# Patient Record
Sex: Male | Born: 1945 | Race: White | Hispanic: No | Marital: Married | Smoking: Never smoker
Health system: Southern US, Community
[De-identification: ages and names within clinical notes are randomized; demographics above are authoritative.]

## PROBLEM LIST (undated history)

## (undated) DIAGNOSIS — E785 Hyperlipidemia, unspecified: Secondary | ICD-10-CM

## (undated) DIAGNOSIS — Z9289 Personal history of other medical treatment: Secondary | ICD-10-CM

## (undated) DIAGNOSIS — N529 Male erectile dysfunction, unspecified: Secondary | ICD-10-CM

## (undated) DIAGNOSIS — I10 Essential (primary) hypertension: Secondary | ICD-10-CM

## (undated) DIAGNOSIS — Z86010 Personal history of colonic polyps: Secondary | ICD-10-CM

## (undated) HISTORY — PX: HERNIA REPAIR: SHX51

## (undated) HISTORY — DX: Personal history of colonic polyps: Z86.010

## (undated) HISTORY — DX: Male erectile dysfunction, unspecified: N52.9

## (undated) HISTORY — DX: Personal history of other medical treatment: Z92.89

## (undated) HISTORY — PX: CATARACT EXTRACTION: SUR2

---

## 2006-06-24 DIAGNOSIS — Z9289 Personal history of other medical treatment: Secondary | ICD-10-CM

## 2006-06-24 HISTORY — DX: Personal history of other medical treatment: Z92.89

## 2006-11-05 ENCOUNTER — Encounter: Admission: RE | Admit: 2006-11-05 | Discharge: 2006-11-05 | Payer: Self-pay | Admitting: General Surgery

## 2007-10-19 ENCOUNTER — Ambulatory Visit (HOSPITAL_BASED_OUTPATIENT_CLINIC_OR_DEPARTMENT_OTHER): Admission: RE | Admit: 2007-10-19 | Discharge: 2007-10-19 | Payer: Self-pay | Admitting: General Surgery

## 2007-10-20 ENCOUNTER — Encounter (INDEPENDENT_AMBULATORY_CARE_PROVIDER_SITE_OTHER): Payer: Self-pay | Admitting: General Surgery

## 2011-01-01 NOTE — Op Note (Signed)
Edward Bush, Edward Bush                  ACCOUNT NO.:  [id number]   MEDICAL RECORD NO.:  [id number]          PATIENT TYPE:  AMB   LOCATION:  DSC                          FACILITY:  [facility]   PHYSICIAN:  Angelia Mould. Derrell Lolling, M.D.DATE OF BIRTH:  [birth year]   DATE OF PROCEDURE:  10/19/2007  DATE OF DISCHARGE:                               OPERATIVE REPORT   PREOPERATIVE DIAGNOSIS:  Soft tissue mass, left posterior neck.   POSTOPERATIVE DIAGNOSIS:  Soft tissue mass, left posterior neck.   OPERATION PERFORMED:  Excision, 1-cm soft tissue mass, left posterior  neck, with layered closure.   SURGEON:  Angelia Mould. Derrell Lolling, M.D.   OPERATIVE INDICATIONS:  This is a 65 year old white man who has had a  soft tissue mass of his left posterior neck at the hairline for several  years.  It has been inflamed and tender in the past and has drained in  the past.  He wants to have this removed.  On exam, there is a mass that  actually feels about 2 cm in size, is firm and round and well-marginated  consistent with a cyst.  There is no active inflammation or drainage at  this time.  He is brought to the operating room electively.   OPERATIVE TECHNIQUE:  The patient was brought to the minor procedure  room at [facility] day surgery center.  He was placed in the right lateral  decubitus position.  The left posterior neck was then prepped and draped  in a sterile fashion.  The patient was identified as correct patient,  correct procedure and correct site.  Xylocaine 1% with epinephrine was  used as a local infiltration anesthetic.  An oblique elliptical incision  was made in Langer's lines.  Dissection was carried down to the deep  subcutaneous tissue and I completely excised what appeared to be an  encapsulated cyst.  The cyst itself was probably 1 cm in size but it  could have been larger.  There was no active purulence.  Hemostasis was  excellent and achieved with pressure.  This was closed in layers.  The  deeper  tissues were closed with three interrupted sutures of 3-0 Vicryl  and the skin closed with a running subcuticular suture of 3-0 Monocryl  and Dermabond.  Clean bandages were placed and the patient was taken to  the recovery room in stable condition.  Estimated blood loss was about 2-  4 mL.  Complications:  None.  Sponge, needle and counts were correct.      Angelia Mould. Derrell Lolling, M.D.  Electronically Signed     HMI/MEDQ  D:  10/19/2007  T:  10/20/2007  Job:  [id number]   cc:   Knox Royalty, MD

## 2011-04-29 HISTORY — PX: TRANSTHORACIC ECHOCARDIOGRAM: SHX275

## 2011-08-07 ENCOUNTER — Encounter: Payer: Self-pay | Admitting: Emergency Medicine

## 2011-08-07 ENCOUNTER — Other Ambulatory Visit: Payer: Self-pay

## 2011-08-07 ENCOUNTER — Emergency Department (HOSPITAL_COMMUNITY)
Admission: EM | Admit: 2011-08-07 | Discharge: 2011-08-07 | Disposition: A | Discharge status: Home / Self Care | Payer: PRIVATE HEALTH INSURANCE | Attending: Emergency Medicine | Admitting: Emergency Medicine

## 2011-08-07 ENCOUNTER — Emergency Department (HOSPITAL_COMMUNITY): Payer: PRIVATE HEALTH INSURANCE

## 2011-08-07 DIAGNOSIS — R110 Nausea: Secondary | ICD-10-CM | POA: Insufficient documentation

## 2011-08-07 DIAGNOSIS — R6889 Other general symptoms and signs: Secondary | ICD-10-CM | POA: Insufficient documentation

## 2011-08-07 DIAGNOSIS — R61 Generalized hyperhidrosis: Secondary | ICD-10-CM | POA: Insufficient documentation

## 2011-08-07 DIAGNOSIS — M546 Pain in thoracic spine: Secondary | ICD-10-CM | POA: Insufficient documentation

## 2011-08-07 DIAGNOSIS — R630 Anorexia: Secondary | ICD-10-CM | POA: Insufficient documentation

## 2011-08-07 DIAGNOSIS — R059 Cough, unspecified: Secondary | ICD-10-CM | POA: Insufficient documentation

## 2011-08-07 DIAGNOSIS — I10 Essential (primary) hypertension: Secondary | ICD-10-CM | POA: Insufficient documentation

## 2011-08-07 DIAGNOSIS — R51 Headache: Secondary | ICD-10-CM | POA: Insufficient documentation

## 2011-08-07 DIAGNOSIS — J3489 Other specified disorders of nose and nasal sinuses: Secondary | ICD-10-CM | POA: Insufficient documentation

## 2011-08-07 DIAGNOSIS — E785 Hyperlipidemia, unspecified: Secondary | ICD-10-CM | POA: Insufficient documentation

## 2011-08-07 DIAGNOSIS — R55 Syncope and collapse: Secondary | ICD-10-CM

## 2011-08-07 DIAGNOSIS — R404 Transient alteration of awareness: Secondary | ICD-10-CM | POA: Insufficient documentation

## 2011-08-07 DIAGNOSIS — J111 Influenza due to unidentified influenza virus with other respiratory manifestations: Secondary | ICD-10-CM

## 2011-08-07 DIAGNOSIS — R05 Cough: Secondary | ICD-10-CM | POA: Insufficient documentation

## 2011-08-07 HISTORY — DX: Essential (primary) hypertension: I10

## 2011-08-07 HISTORY — DX: Hyperlipidemia, unspecified: E78.5

## 2011-08-07 LAB — URINALYSIS, ROUTINE W REFLEX MICROSCOPIC
Bilirubin Urine: NEGATIVE
Glucose, UA: NEGATIVE mg/dL
Hgb urine dipstick: NEGATIVE
Ketones, ur: NEGATIVE mg/dL
Leukocytes, UA: NEGATIVE
Nitrite: NEGATIVE
Protein, ur: NEGATIVE mg/dL
Specific Gravity, Urine: 1.021 (ref 1.005–1.030)
Urobilinogen, UA: 0.2 mg/dL (ref 0.0–1.0)
pH: 5.5 (ref 5.0–8.0)

## 2011-08-07 LAB — CARDIAC PANEL(CRET KIN+CKTOT+MB+TROPI)
CK, MB: 1.8 ng/mL (ref 0.3–4.0)
Relative Index: 1 (ref 0.0–2.5)
Total CK: 180 U/L (ref 7–232)
Troponin I: <0.3 ng/mL (ref <0.30)

## 2011-08-07 LAB — COMPREHENSIVE METABOLIC PANEL
ALT: 25 U/L (ref 0–53)
AST: 21 U/L (ref 0–37)
Albumin: 3.6 g/dL (ref 3.5–5.2)
Alkaline Phosphatase: 55 U/L (ref 39–117)
BUN: 23 mg/dL (ref 6–23)
CO2: 28 mEq/L (ref 19–32)
Calcium: 9.3 mg/dL (ref 8.4–10.5)
Chloride: 98 mEq/L (ref 96–112)
Creatinine, Ser: 1.2 mg/dL (ref 0.50–1.35)
GFR calc Af Amer: 71 mL/min — ABNORMAL LOW (ref >90)
GFR calc non Af Amer: 61 mL/min — ABNORMAL LOW (ref >90)
Glucose, Bld: 130 mg/dL — ABNORMAL HIGH (ref 70–99)
Potassium: 3.3 mEq/L — ABNORMAL LOW (ref 3.5–5.1)
Sodium: 135 mEq/L (ref 135–145)
Total Bilirubin: 1.7 mg/dL — ABNORMAL HIGH (ref 0.3–1.2)
Total Protein: 7 g/dL (ref 6.0–8.3)

## 2011-08-07 LAB — CBC
HCT: 41.4 % (ref 39.0–52.0)
Hemoglobin: 15 g/dL (ref 13.0–17.0)
MCH: 31.3 pg (ref 26.0–34.0)
MCHC: 36.2 g/dL — ABNORMAL HIGH (ref 30.0–36.0)
MCV: 86.3 fL (ref 78.0–100.0)
Platelets: 125 10*3/uL — ABNORMAL LOW (ref 150–400)
RBC: 4.8 MIL/uL (ref 4.22–5.81)
RDW: 13.3 % (ref 11.5–15.5)
WBC: 13.7 10*3/uL — ABNORMAL HIGH (ref 4.0–10.5)

## 2011-08-07 LAB — DIFFERENTIAL
Basophils Absolute: 0 10*3/uL (ref 0.0–0.1)
Basophils Relative: 0 % (ref 0–1)
Eosinophils Absolute: 0.2 10*3/uL (ref 0.0–0.7)
Eosinophils Relative: 2 % (ref 0–5)
Lymphocytes Relative: 15 % (ref 12–46)
Lymphs Abs: 2 10*3/uL (ref 0.7–4.0)
Monocytes Absolute: 1.8 10*3/uL — ABNORMAL HIGH (ref 0.1–1.0)
Monocytes Relative: 13 % — ABNORMAL HIGH (ref 3–12)
Neutro Abs: 9.7 10*3/uL — ABNORMAL HIGH (ref 1.7–7.7)
Neutrophils Relative %: 71 % (ref 43–77)

## 2011-08-07 LAB — D-DIMER, QUANTITATIVE: D-Dimer, Quant: 0.37 ug/mL-FEU (ref 0.00–0.48)

## 2011-08-07 MED ORDER — SODIUM CHLORIDE 0.9 % IV BOLUS (SEPSIS)
1000.0000 mL | Freq: Once | INTRAVENOUS | Status: AC
  Administered 2011-08-07: 1000 mL via INTRAVENOUS

## 2011-08-07 MED ORDER — SODIUM CHLORIDE 0.9 % IV BOLUS (SEPSIS): 1000.0000 mL | Freq: Once | INTRAVENOUS | Status: DC

## 2011-08-07 NOTE — ED Provider Notes (Signed)
Medical screening examination/treatment/procedure(s) were conducted as a shared visit with non-physician practitioner(s) and myself.  I personally evaluated the patient during the encounter  Patient seen by me. Brought in by EMS for syncopal episode that occurred at a local doctor's office where his wife is being seen for flulike symptoms. Patient also describes a 3 day history of flulike illness with congestion bodyaches some mild diarrhea decreased appetite some nausea. In the office patient became ill feeling, very weak, mild nausea, and then passed out and awoke on the floor. Since being in the emergency department he has felt fine feels much better with IV fluids workup in the imaged department without significant findings other than a mild leukocytosis with a white count in the 13,000 range. Chest x-ray was negative. Patient denies any chest pains had no cardiac arrhythmias here. Based on his recent history and the events of the doctor's office I doubt this was a cardiac arrhythmia syncope, suspect it was a vasovagal event secondary to the influenza. Patient will be okay to go home patient prefers to go home.  Shelda Jakes, MD 08/07/11 1929

## 2011-08-07 NOTE — ED Notes (Signed)
BMW:[id number]<GM> Expected date:08/07/11<BR> Expected time: 1:54 PM<BR> Means of arrival:Ambulance<BR> Comments:<BR> m40

## 2011-08-07 NOTE — ED Notes (Signed)
MD at bedside. 

## 2011-08-07 NOTE — ED Notes (Signed)
Rainbow drawn and sent to lab. 1 blue, 1 lavendar, 1 light green and one dark green in mini lab

## 2011-08-07 NOTE — ED Notes (Signed)
Pt given discharge instructions and verbalizes understanding  

## 2011-08-07 NOTE — ED Notes (Signed)
Pt presenting to ed with c/o syncopal episode while with his wife at her doctor's appointment. Pt states he has not been feeling well the past couple of days he feels like he's getting a cold. Pt states he does feel nauseous but denies vomiting at this time. Pt states he was sitting at the time of onset

## 2011-08-07 NOTE — ED Provider Notes (Signed)
History     CSN: [id number] Arrival date & time: 08/07/2011  2:22 PM   First MD Initiated Contact with Patient 08/07/11 1435      Chief Complaint  Patient presents with  . Loss of Consciousness    (Consider location/radiation/quality/duration/timing/severity/associated sxs/prior treatment) Patient is a 65 y.o. male presenting with syncope. The history is provided by the patient.  Loss of Consciousness  Edward Bush is a 64 y.o. male who presents to the emergency department after a syncopal episode.  Pt was in the MD's office with his wife (she was the patient) when pt experienced nausea, diaphoresis and a witnessed full LOC while sitting in a chair.  Pt states he has had a head cold x 3 days with cough, congestion, sinus pain and sore throat.  Pt states he has been eating chicken soup and taking PO fluids and not much solid PO intake.  PMHx HTN, but denies other cardiac history; no previous episodes like this is the past.  Pt did take his normal blood pressure medications this AM.  Pt has also been taking decongestants for 3 days.     Past Medical History  Diagnosis Date  . Hypertension   . Hyperlipemia     History reviewed. No pertinent past surgical history.  No family history on file.  History  Substance Use Topics  . Smoking status: Never Smoker   . Smokeless tobacco: Not on file  . Alcohol Use: No      Review of Systems  Cardiovascular: Positive for syncope.   All pertinent positives and negatives in the history of present illness  Allergies  Review of patient's allergies indicates not on file.  Home Medications  No current outpatient prescriptions on file.  BP 101/77  Pulse 54  Temp(Src) 98 F (36.7 C) (Oral)  Resp 20  SpO2 96%  Physical Exam  Constitutional: He is oriented to person, place, and time. He appears well-developed and well-nourished. No distress.  HENT:  Head: Normocephalic and atraumatic.  Right Ear: External ear normal.  Left Ear:  External ear normal.  Nose: Nose normal.  Mouth/Throat: Oropharynx is clear and moist.  Eyes: Conjunctivae and EOM are normal. Pupils are equal, round, and reactive to light.  Neck: Normal range of motion. Neck supple. No JVD present.  Cardiovascular: Normal rate, regular rhythm, normal heart sounds and intact distal pulses.  Exam reveals no gallop and no friction rub.   No murmur heard. Pulmonary/Chest: Effort normal and breath sounds normal. No respiratory distress. He has no wheezes. He has no rales. He exhibits no tenderness.  Abdominal: Soft. Bowel sounds are normal. He exhibits no distension and no mass. There is no tenderness. There is no rebound and no guarding.  Lymphadenopathy:    He has no cervical adenopathy.  Neurological: He is alert and oriented to person, place, and time. He has normal reflexes.  Skin: Skin is warm and dry. He is not diaphoretic.  Psychiatric: He has a normal mood and affect. His behavior is normal. Judgment and thought content normal.    ED Course  Procedures (including critical care time)  7:09 PM Patient is feeling much better following his first liter of fluid.  Patient is describing what appears to be a vasovagal type syncopal episode and this is based on the fact that he has been recently ill with an influenza-like illness.  Patient states a warm feeling and weak with some mild nausea before waking up on the floor at the doctor's office.  Patient states it has been in taking oral fluids as per normal.  Patient states that he would like to go home.  I feel that this is most likely okay due to the fact that this seems to be a vagal response to the dehydration and his current influenza-like illness.     8:07 PM The patient is improved and still feeling better. He will be ambulated around the department.   8:22 PM A she ambulated without any difficulties and will be sent home and advised to return here for any worsening in his condition.  MDM   MDM Interpretation: labs, ECG and x-ray      Date: 08/07/2011  Rate:54  Rhythm: normal sinus rhythm  QRS Axis: normal  Intervals: normal  ST/T Wave abnormalities: normal  Conduction Disutrbances:left anterior fascicular block  Narrative Interpretation:   Old EKG Reviewed: none available         Carlyle Dolly, PA-C 08/07/11 1913  Carlyle Dolly, PA-C 08/07/11 2005  Carlyle Dolly, PA-C 08/07/11 2022

## 2011-08-07 NOTE — ED Notes (Signed)
Pt had an bowel incontinent episode that he states is not usual for him but he has been sick and just could not 'hold it.' He is getting cleaned up now.

## 2011-08-08 NOTE — ED Provider Notes (Signed)
Medical screening examination/treatment/procedure(s) were performed by non-physician practitioner and as supervising physician I was immediately available for consultation/collaboration.   Eman Morimoto, MD 08/08/11 0641 

## 2012-11-26 ENCOUNTER — Encounter (INDEPENDENT_AMBULATORY_CARE_PROVIDER_SITE_OTHER): Payer: Self-pay | Admitting: General Surgery

## 2012-11-26 ENCOUNTER — Ambulatory Visit (INDEPENDENT_AMBULATORY_CARE_PROVIDER_SITE_OTHER): Payer: Self-pay | Admitting: General Surgery

## 2012-11-26 VITALS — BP 120/82 | HR 60 | Temp 97.2°F | Resp 18 | Ht 69.0 in | Wt 197.0 lb

## 2012-11-26 DIAGNOSIS — L723 Sebaceous cyst: Secondary | ICD-10-CM | POA: Insufficient documentation

## 2012-11-26 NOTE — Patient Instructions (Signed)
You have a large, benign epidermoid cyst of your upper back. It is not infected at this time.  You will be scheduled for excision of this cyst in the minor procedure room in my office in the near future. We will do this under local anesthesia.

## 2012-11-26 NOTE — Progress Notes (Signed)
Patient ID: Edward Bush, male   DOB: 08-Feb-1946, 67 y.o.   MRN: [id number]  Chief Complaint  Patient presents with  . Cyst    HPI Edward Bush is a 67 y.o. male.  He returns to see me  to request removal of a large sebaceous cyst of his upper back.   In 2010 repaired inguinal hernias on this gentleman and he has no problems with that. He states that he has a enlarging soft tissue mass of his upper back. He used to be able to squeeze material out of this but cannot do any more. Both he and his wife of like to have this removed.  Comorbidities include hypertension, hyperlipidemia, on aspirin. He is followed by [place] [facility] for hypertension. His health has been very stable. HPI  Past Medical History  Diagnosis Date  . Hypertension   . Hyperlipemia     Past Surgical History  Procedure Laterality Date  . Hernia repair      No family history on file.  Social History History  Substance Use Topics  . Smoking status: Never Smoker   . Smokeless tobacco: Not on file  . Alcohol Use: No    Allergies  Allergen Reactions  . Sulfa Antibiotics Rash    Current Outpatient Prescriptions  Medication Sig Dispense Refill  . amLODipine (NORVASC) 10 MG tablet Take 10 mg by mouth daily.        Marland Kitchen aspirin EC 81 MG tablet Take 81 mg by mouth daily.        . busPIRone (BUSPAR) 30 MG tablet Take 15 mg by mouth daily. Patient takes half of a 30mg  tablet       . carvedilol (COREG) 25 MG tablet Take 25 mg by mouth 2 (two) times daily with a meal.        . Chlorpheniramine-Acetaminophen (CORICIDIN HBP COLD/FLU PO) Take by mouth as needed.      . fish oil-omega-3 fatty acids 1000 MG capsule Take 1 g by mouth daily.        . Potassium 95 MG TABS Take 1 tablet by mouth daily.        Marland Kitchen VALSARTAN PO Take by mouth daily.       No current facility-administered medications for this visit.    Review of Systems Review of Systems  Constitutional: Negative for fever,  chills and unexpected weight change.  HENT: Negative for hearing loss, congestion, sore throat, trouble swallowing and voice change.   Eyes: Negative for visual disturbance.  Respiratory: Negative for cough and wheezing.   Cardiovascular: Negative for chest pain, palpitations and leg swelling.  Gastrointestinal: Negative for nausea, vomiting, abdominal pain, diarrhea, constipation, blood in stool, abdominal distention, anal bleeding and rectal pain.  Genitourinary: Negative for hematuria and difficulty urinating.  Musculoskeletal: Negative for arthralgias.  Skin: Negative for rash and wound.  Neurological: Negative for seizures, syncope, weakness and headaches.  Hematological: Negative for adenopathy. Does not bruise/bleed easily.  Psychiatric/Behavioral: Negative for confusion.    Blood pressure 120/82, pulse 60, temperature 97.2 F (36.2 C), temperature source Oral, resp. rate 18, height 5\' 9"  (1.753 m), weight 197 lb (89.359 kg).  Physical Exam Physical Exam  Constitutional: He is oriented to person, place, and time. He appears well-developed and well-nourished. No distress.  HENT:  Head: Normocephalic.  Eyes: Conjunctivae and EOM are normal. Pupils are equal, round, and reactive to light. Right eye exhibits no discharge. Left eye exhibits no discharge. No  scleral icterus.  Neck: Normal range of motion. Neck supple. No JVD present. No tracheal deviation present. No thyromegaly present.  Cardiovascular: Normal rate, regular rhythm, normal heart sounds and intact distal pulses.   No murmur heard. Pulmonary/Chest: Effort normal and breath sounds normal. No stridor. No respiratory distress. He has no wheezes. He has no rales. He exhibits no tenderness.  No axillary adenopathy. No other chest wall masses.  Abdominal: Soft. Bowel sounds are normal. He exhibits no distension.  Musculoskeletal: Normal range of motion. He exhibits no edema and no tenderness.  Lymphadenopathy:    He has no  cervical adenopathy.  Neurological: He is alert and oriented to person, place, and time. He has normal reflexes. Coordination normal.  Skin: Skin is warm and dry. No rash noted. He is not diaphoretic. No erythema. No pallor.  3 cm sebaceous cyst in midline of back, overlying approximately T3. Not draining. Not obviously infected.  Psychiatric: He has a normal mood and affect. His behavior is normal. Judgment and thought content normal.    Data Reviewed Chart records  Assessment    3 cm soft tissue mass midline upper thoracic area. Suspect epidermoid cyst  Hypertension  History inguinal hernia surgery. No evidence of recurrence  Hyperlipidemia     Plan    Scheduled for excision of this soft tissue mass of his back under local anesthesia in the office in the near future.He declined IV sedation or anesthesia for this.  Discontinue aspirin 3 or 4 days preop  I discussed the indications, details, techniques, and numerous risks of the surgery with him. He understands all the scope issues and all his questions are answered. He agrees with this plan.        Angelia Mould. Derrell Lolling, M.D., [facility] General and Minimally invasive Surgery Breast and Colorectal Surgery Office:   [phone] Pager:   [phone]  11/26/2012, 8:59 AM

## 2012-12-11 ENCOUNTER — Encounter (INDEPENDENT_AMBULATORY_CARE_PROVIDER_SITE_OTHER): Payer: Self-pay | Admitting: General Surgery

## 2012-12-11 ENCOUNTER — Ambulatory Visit (INDEPENDENT_AMBULATORY_CARE_PROVIDER_SITE_OTHER): Payer: Medicare Other | Admitting: General Surgery

## 2012-12-11 ENCOUNTER — Ambulatory Visit (INDEPENDENT_AMBULATORY_CARE_PROVIDER_SITE_OTHER): Payer: Medicare Other

## 2012-12-11 VITALS — BP 148/90 | HR 60 | Temp 97.3°F | Resp 16 | Ht 69.0 in | Wt 196.4 lb

## 2012-12-11 DIAGNOSIS — L723 Sebaceous cyst: Secondary | ICD-10-CM

## 2012-12-11 NOTE — Progress Notes (Signed)
Patient ID: Edward Bush, male   DOB: May 03, 1946, 67 y.o.   MRN: [id number] History: Patient returns for removal of 3.5 cm sebaceous cyst of upper mid back. Medication allergy history reconfirmed.  Procedure note: Patient taken to the minor procedure room placed prone. Upper back prepped and draped in sterile fashion. 1% Xylocaine with epinephrine local. Transverse elliptical incision approximately 1 cm vertically by 6 cm transversely. Incision made with knife.  dissection  widely all the way down to the muscle fascia excising what appeared to be a large benign sebaceous cyst, multilobulated. Completely excised. Minor capillary bleeding packed for a few minutes and this seemed to control that. Subcutaneous tissue closed with interrupted 3-0 Vicryl sutures. Skin closed with interrupted 3-0 nylon sutures. No bleeding. Dry gauze bandage. Tolerated well.  Assessment: 3.5 cm sebaceous cyst upper back, excised today with layered closure  Plan: Prescription for Vicodin given Wound care discussed and written instructions given Return 10 days for suture removal Specimen sent to pathology.  Angelia Mould. Derrell Lolling, M.D., [facility] General and Minimally invasive Surgery Breast and Colorectal Surgery Office:   [phone] Pager:   [phone]  .

## 2012-12-11 NOTE — Patient Instructions (Signed)
Go home and do very little physical activity today. Put an ice pack on the wound intermittently for the next 24 hours.  Change the bandage if it becomes wet or soiled otherwise leave it in place for 48 hours.  May take a quick shower, starting Sunday.  Call office if there is any unusual bleeding or excessive pain.  You'll be given an appointment in about 10 days to have the sutures removed.

## 2012-12-14 ENCOUNTER — Telehealth (INDEPENDENT_AMBULATORY_CARE_PROVIDER_SITE_OTHER): Payer: Self-pay

## 2012-12-14 NOTE — Telephone Encounter (Signed)
Message copied by Ivory Broad on Mon Dec 14, 2012  4:49 PM ------      Message from: Ernestene Mention      Created: Mon Dec 14, 2012  4:45 PM       Inform patient of Pathology report,.  Benign cyst, as expected. ------

## 2012-12-14 NOTE — Telephone Encounter (Signed)
I called and notified the pt.  He asked about removing the bandage.  I told him only to keep it on if it drains any or if his clothes irritate the incision by rubbing on the sutures.

## 2012-12-14 NOTE — Progress Notes (Signed)
Quick Note:  Inform patient of Pathology report,. Benign cyst, as expected. ______

## 2012-12-21 ENCOUNTER — Ambulatory Visit (INDEPENDENT_AMBULATORY_CARE_PROVIDER_SITE_OTHER): Payer: Medicare Other

## 2012-12-21 DIAGNOSIS — Z4802 Encounter for removal of sutures: Secondary | ICD-10-CM

## 2012-12-21 NOTE — Progress Notes (Signed)
Pt comes in today for suture removal post cyst removal.  After getting the pt situated on the table I examined the incision.  The incision looks good.  I then moistened the incision with chloraprep and removed a total of 5 sutures.  I then placed 3 steri-strips on the incision.

## 2013-03-23 ENCOUNTER — Encounter: Payer: Self-pay | Admitting: *Deleted

## 2013-03-26 ENCOUNTER — Ambulatory Visit (INDEPENDENT_AMBULATORY_CARE_PROVIDER_SITE_OTHER): Payer: Medicare Other | Admitting: Internal Medicine

## 2013-03-26 ENCOUNTER — Encounter: Payer: Self-pay | Admitting: Internal Medicine

## 2013-03-26 VITALS — BP 136/88 | HR 56 | Ht 69.5 in | Wt 197.2 lb

## 2013-03-26 DIAGNOSIS — I10 Essential (primary) hypertension: Secondary | ICD-10-CM

## 2013-03-26 DIAGNOSIS — E785 Hyperlipidemia, unspecified: Secondary | ICD-10-CM

## 2013-03-26 DIAGNOSIS — N529 Male erectile dysfunction, unspecified: Secondary | ICD-10-CM

## 2013-03-26 NOTE — Patient Instructions (Addendum)
Your physician wants you to follow-up in: 1 year. You will receive a reminder letter in the mail two months in advance. If you don't receive a letter, please call our office to schedule the follow-up appointment.  

## 2013-03-26 NOTE — Progress Notes (Signed)
OFFICE NOTE  Chief Complaint:  Routine office visit  Primary Care Physician: Barkley Bruns, MD  HPI: Edward Bush is a 67 year old gentleman with hypertension, erectile dysfunction, mild dyslipidemia. Blood pressures were mildly elevated, however, he was having difficulty affording his previous regimen of Micardis and Tekturna. I had discontinued the Micardis and put him on valsartan/hydrochlorothiazide which is working fairly well and he was interested in coming off the [place] due to cost. I recommended starting him on hydralazine at this time and he is done fairly well with this. He actually is taking hydralazine only once daily and twice if he needs to, but blood pressure control seems to be improved. He is otherwise asymptomatic, no chest pain, shortness of breath, palpitations, presyncope or syncopal symptoms. His other concerns today are that he is requesting refills on his buspirone which I will defer to his primary care provider. He also is interested in a sedan a filled prescription. He believes that he can get the medication cheaper through [facility] in [place].  PMHx:  Past Medical History  Diagnosis Date  . Hypertension   . Hyperlipemia   . ED (erectile dysfunction)   . History of nuclear stress test 06/24/2006    exercise myoview; normal pattern of perfusion; low risk scan     Past Surgical History  Procedure Laterality Date  . Hernia repair    . Transthoracic echocardiogram  04/29/2011    EF>55%; mild concentric LVH; LA mod dilated; trace MR/trace TR,/trace pulm valve regurg;     FAMHx:  Family History  Problem Relation Age of Onset  . Hypertension Mother   . Hypertension Maternal Grandmother   . Cancer Maternal Grandmother   . Cancer - Lung Maternal Grandfather   . Hypertension Brother     x2  . Cancer Brother     kidney cancer    SOCHx:   reports that he has never smoked. He has never used smokeless tobacco. He reports that he does not drink alcohol  or use illicit drugs.  ALLERGIES:  Allergies  Allergen Reactions  . Sulfa Antibiotics Rash    ROS: A comprehensive review of systems was negative except for: Genitourinary: positive for ED Behavioral/Psych: positive for anxiety  HOME MEDS: Current Outpatient Prescriptions  Medication Sig Dispense Refill  . amLODipine (NORVASC) 10 MG tablet Take 10 mg by mouth daily.        Marland Kitchen aspirin EC 81 MG tablet Take 81 mg by mouth daily.        . busPIRone (BUSPAR) 30 MG tablet Take 15 mg by mouth 2 (two) times daily.       . carvedilol (COREG) 25 MG tablet Take 25 mg by mouth 2 (two) times daily with a meal.        . Chlorpheniramine-Acetaminophen (CORICIDIN HBP COLD/FLU PO) Take by mouth as needed.      . fish oil-omega-3 fatty acids 1000 MG capsule Take 1 g by mouth daily.        . hydrALAZINE (APRESOLINE) 25 MG tablet Take 25 mg by mouth 3 (three) times daily.       . Potassium 95 MG TABS Take 1 tablet by mouth daily.        . sildenafil (REVATIO) 20 MG tablet Take 100 mg by mouth daily. Take 5 tabs      . valsartan-hydrochlorothiazide (DIOVAN-HCT) 320-25 MG per tablet Take 1 tablet by mouth daily.       No current facility-administered medications for this visit.  LABS/IMAGING: No results found for this or any previous visit (from the past 48 hour(s)). No results found.  VITALS: BP 136/88  Pulse 56  Ht 5' 9.5" (1.765 m)  Wt 197 lb 3.2 oz (89.449 kg)  BMI 28.71 kg/m2  EXAM: General appearance: alert and no distress Neck: no adenopathy, no carotid bruit, no JVD, supple, symmetrical, trachea midline and thyroid not enlarged, symmetric, no tenderness/mass/nodules Lungs: clear to auscultation bilaterally Heart: regular rate and rhythm, S1, S2 normal, no murmur, click, rub or gallop Abdomen: soft, non-tender; bowel sounds normal; no masses,  no organomegaly Extremities: extremities normal, atraumatic, no cyanosis or edema Pulses: 2+ and symmetric Skin: Skin color, texture, turgor  normal. No rashes or lesions Neurologic: Grossly normal  EKG: Sinus bradycardia 56, left posterior fascicular block  ASSESSMENT: 1. Hypertension-controlled 2. Dyslipidemia 3. Erectile dysfunction 4. Anxiety  PLAN: 1.   Edward Bush is doing fairly well with regards to his blood pressure. He is only taking the hydralazine once daily but this seems to work for him. We'll continue that and of course she can take a second pill as needed if his blood pressure is elevated. I defer management of his anxiety to his primary care provider did provide a prescription at his request for sildenafil today. Hopefully this can be provided by his primary care provider in the future. I've cautioned him against using nitrates with sildenafil, however he is not on any currently. We can see him back in followup in a year.  Chrystie Nose, MD, [facility] Attending Cardiologist [facility]  HILTY,Kenneth C 03/26/2013, 11:12 AM

## 2013-05-12 ENCOUNTER — Telehealth: Payer: Self-pay | Admitting: Internal Medicine

## 2013-05-12 NOTE — Telephone Encounter (Signed)
Patient has a question about one of his medications, Amlodipine?

## 2013-05-12 NOTE — Telephone Encounter (Signed)
Returned call.  Pt stated he is taking amlodipine and it seems to be causing his feet and hands to swell.  Pt wants to know if Dr. Rennis Golden will prescribe something else that doesn't have those side effects.  Stated he read that it can cause it and stopped it for a few days and the swelling went away.  Pt denied any other problems or symptoms.  Stated he would rather have something that doesn't cause the swelling.  Pt informed Dr. Rennis Golden will be notified for further instructions.  Pt verbalized understanding and agreed w/ plan.  Pt uses [facility] [address]  Message forwarded to Dr. Rennis Golden.

## 2013-05-12 NOTE — Telephone Encounter (Signed)
Returned call and informed pt per instructions by MD/PA.  Pt verbalized understanding.  Pt advised to contact his pharmacist to get updated information on clonidine and if symptoms persist and he would like to revisit issue to call back.  Pt agreed w/ plan.

## 2013-05-12 NOTE — Telephone Encounter (Signed)
Amlodipine certainly can cause this .Marland Kitchen He has been on this medication for a while. He is also on 2 other blood pressure medications and his blood pressure is not optimally controlled. I'm not sure there is a good alternative to amlodipine for his blood pressure. He is already on high dose b-blocker and combination ARB/diuretic as well as hydralazine. The only other option may be an alpha-blocker such as clonidine, but that medication has significant side-effects as well.  -Dr. Rennis Golden

## 2013-05-20 ENCOUNTER — Telehealth: Payer: Self-pay | Admitting: Internal Medicine

## 2013-05-20 ENCOUNTER — Telehealth: Payer: Self-pay | Admitting: *Deleted

## 2013-05-20 MED ORDER — CLONIDINE HCL 0.1 MG PO TABS: 0.1000 mg | ORAL_TABLET | Freq: Two times a day (BID) | ORAL | Status: DC

## 2013-05-20 NOTE — Telephone Encounter (Signed)
Message copied by Lindell Spar on Thu May 20, 2013  3:14 PM ------      Message from: Chrystie Nose      Created: Thu May 20, 2013  3:08 PM       Ok .Marland Kitchen Have him take clonidine 0.1 mg twice daily instead of amlodipine. Dry mouth, urinary retention, fatigue and other side effects may be noted with this medication.  They could be worse than the swelling.  FYI.            -Dr. Rennis Golden      ----- Message -----         From: Gaynelle Cage, CMA         Sent: 05/20/2013   2:52 PM           To: Chrystie Nose, MD            Dr Rennis Golden  This patient said that you offered to change his amlodipine to something different due to swelling. He has decided to do so. Please advise Eileen Stanford what to call in for him and let the patient know. Thanks.       ------

## 2013-05-20 NOTE — Telephone Encounter (Signed)
Patient states Dr. Rennis Golden said  he would change his amlodipine to something else due to swelling. I will notify Dr. Rennis Golden the patient has decided to try something different.

## 2013-05-20 NOTE — Telephone Encounter (Signed)
Please call-concerning switching his medicine.

## 2013-05-20 NOTE — Telephone Encounter (Signed)
Called patient with recommendations per Dr. Rennis Golden to discontinue amlodipine 10mg  use and start clonidine 0.1mg  twice daily. Informed patient to keep track of BPs and call us in a few weeks to report how he is doing on this medication and if he is having SE. Patient agreed with plan and verbalized understanding. Order for new medication placed and sent to pharmacy electronically.

## 2013-07-14 ENCOUNTER — Telehealth: Payer: Self-pay | Admitting: Internal Medicine

## 2013-07-14 NOTE — Telephone Encounter (Signed)
Pt tried the clonidine and had severe dry mouth.  He said that it controlled his blood pressure but couldn't tolerate the side effects.  He went back on the amlodipine.  I advised to check his blood pressure every couple of days to verity that it is under control with all of the changes that have been made recently.  Pt verbalized understanding.

## 2013-07-14 NOTE — Telephone Encounter (Signed)
Wanted you to know he went back Amlodipine.

## 2013-08-20 ENCOUNTER — Other Ambulatory Visit: Payer: Self-pay | Admitting: Internal Medicine

## 2013-08-20 NOTE — Telephone Encounter (Signed)
Rx was sent to pharmacy electronically. 

## 2014-02-11 ENCOUNTER — Other Ambulatory Visit: Payer: Self-pay | Admitting: Internal Medicine

## 2014-02-11 NOTE — Telephone Encounter (Signed)
Rx refill sent to patient pharmacy   

## 2014-08-08 ENCOUNTER — Other Ambulatory Visit: Payer: Self-pay | Admitting: Internal Medicine

## 2014-08-08 NOTE — Telephone Encounter (Signed)
Rx refill sent to patient pharmacy  Note sent to pharmacy for patient to make appointment

## 2014-10-07 ENCOUNTER — Telehealth: Payer: Self-pay | Admitting: Internal Medicine

## 2014-10-07 MED ORDER — CARVEDILOL 25 MG PO TABS: 25.0000 mg | ORAL_TABLET | Freq: Two times a day (BID) | ORAL | Status: DC

## 2014-10-07 MED ORDER — HYDRALAZINE HCL 25 MG PO TABS: 25.0000 mg | ORAL_TABLET | Freq: Three times a day (TID) | ORAL | Status: DC

## 2014-10-07 MED ORDER — VALSARTAN-HYDROCHLOROTHIAZIDE 320-25 MG PO TABS: 1.0000 | ORAL_TABLET | Freq: Every day | ORAL | Status: DC

## 2014-10-07 MED ORDER — AMLODIPINE BESYLATE 10 MG PO TABS: 10.0000 mg | ORAL_TABLET | Freq: Every day | ORAL | Status: DC

## 2014-10-07 NOTE — Telephone Encounter (Signed)
Rx refill sent to patient pharmacy  Spoke with patient to let him know that he is only allowed to get a 30 day supply until his appointment with Dr.Hilty on 3/11. Patient voiced understanding

## 2014-10-07 NOTE — Telephone Encounter (Signed)
°  1. Which medications need to be refilled? Valsartan HCTZ, Amlodipine,Carvedilol, Hydralazine  2. Which pharmacy is medication to be sent to?[facility]  3. Do they need a 30 day or 90 day supply? 90  4. Would they like a call back once the medication has been sent to the pharmacy? yes

## 2014-10-28 ENCOUNTER — Ambulatory Visit (INDEPENDENT_AMBULATORY_CARE_PROVIDER_SITE_OTHER): Payer: Medicare Other | Admitting: Internal Medicine

## 2014-10-28 ENCOUNTER — Encounter: Payer: Self-pay | Admitting: Internal Medicine

## 2014-10-28 VITALS — BP 150/82 | HR 61 | Ht 68.0 in | Wt 201.4 lb

## 2014-10-28 DIAGNOSIS — E785 Hyperlipidemia, unspecified: Secondary | ICD-10-CM

## 2014-10-28 DIAGNOSIS — I10 Essential (primary) hypertension: Secondary | ICD-10-CM

## 2014-10-28 MED ORDER — CARVEDILOL 25 MG PO TABS: 25.0000 mg | ORAL_TABLET | Freq: Two times a day (BID) | ORAL | Status: DC

## 2014-10-28 MED ORDER — HYDRALAZINE HCL 25 MG PO TABS: 25.0000 mg | ORAL_TABLET | Freq: Three times a day (TID) | ORAL | Status: DC

## 2014-10-28 MED ORDER — VALSARTAN-HYDROCHLOROTHIAZIDE 320-25 MG PO TABS: 1.0000 | ORAL_TABLET | Freq: Every day | ORAL | Status: DC

## 2014-10-28 MED ORDER — AMLODIPINE BESYLATE 10 MG PO TABS: 10.0000 mg | ORAL_TABLET | Freq: Every day | ORAL | Status: DC

## 2014-10-28 NOTE — Progress Notes (Signed)
OFFICE NOTE  Chief Complaint:  Routine office visit  Primary Care Physician: Pauline Good, MD  HPI: Edward Bush is a 69 year old gentleman with hypertension, erectile dysfunction, mild dyslipidemia. Blood pressures were mildly elevated, however, he was having difficulty affording his previous regimen of Micardis and Tekturna. I had discontinued the Micardis and put him on valsartan/hydrochlorothiazide which is working fairly well and he was interested in coming off the [place] due to cost. I recommended starting him on hydralazine at this time and he is done fairly well with this. He actually is taking hydralazine only once daily and twice if he needs to, but blood pressure control seems to be improved. He is otherwise asymptomatic, no chest pain, shortness of breath, palpitations, presyncope or syncopal symptoms. His other concerns today are that he is requesting refills on his buspirone which I will defer to his primary care provider. He also is interested in a sedan a filled prescription. He believes that he can get the medication cheaper through [facility] in [place].  SI Mr. no other back in the office today. It's been a couple of years since I seen him. He's had some problems with swelling in his ankles and was switched to clonidine, but had significant dry mouth and then switch back to amlodipine. Unfortunately he's on a number of blood pressure medications and doses and requires his medicines to maintain normal blood pressures. He has no cardiac complaints, specifically denying any shortness of breath or chest pain. EKG today shows normal sinus rhythm at 61.  PMHx:  Past Medical History  Diagnosis Date  . Hypertension   . Hyperlipemia   . ED (erectile dysfunction)   . History of nuclear stress test 06/24/2006    exercise myoview; normal pattern of perfusion; low risk scan     Past Surgical History  Procedure Laterality Date  . Hernia repair    . Transthoracic  echocardiogram  04/29/2011    EF>55%; mild concentric LVH; LA mod dilated; trace MR/trace TR,/trace pulm valve regurg;     FAMHx:  Family History  Problem Relation Age of Onset  . Hypertension Mother   . Hypertension Maternal Grandmother   . Cancer Maternal Grandmother   . Cancer - Lung Maternal Grandfather   . Hypertension Brother     x2  . Cancer Brother     kidney cancer    SOCHx:   reports that he has never smoked. He has never used smokeless tobacco. He reports that he does not drink alcohol or use illicit drugs.  ALLERGIES:  Allergies  Allergen Reactions  . Sulfa Antibiotics Rash    ROS: A comprehensive review of systems was negative except for: Cardiovascular: positive for lower extremity edema Genitourinary: positive for ED Behavioral/Psych: positive for anxiety  HOME MEDS: Current Outpatient Prescriptions  Medication Sig Dispense Refill  . amLODipine (NORVASC) 10 MG tablet Take 1 tablet (10 mg total) by mouth daily. 90 tablet 3  . aspirin EC 81 MG tablet Take 81 mg by mouth daily.      . busPIRone (BUSPAR) 30 MG tablet Take 15 mg by mouth 2 (two) times daily.     . carvedilol (COREG) 25 MG tablet Take 1 tablet (25 mg total) by mouth 2 (two) times daily. 180 tablet 3  . Chlorpheniramine-Acetaminophen (CORICIDIN HBP COLD/FLU PO) Take by mouth as needed.    . fish oil-omega-3 fatty acids 1000 MG capsule Take 1 g by mouth daily.      . hydrALAZINE (APRESOLINE) 25  MG tablet Take 1 tablet (25 mg total) by mouth 3 (three) times daily. 270 tablet 3  . Potassium 95 MG TABS Take 1 tablet by mouth daily.      . sildenafil (REVATIO) 20 MG tablet Take 100 mg by mouth daily. Take 5 tabs    . valsartan-hydrochlorothiazide (DIOVAN-HCT) 320-25 MG per tablet Take 1 tablet by mouth daily. 90 tablet 3   No current facility-administered medications for this visit.    LABS/IMAGING: No results found for this or any previous visit (from the past 48 hour(s)). No results  found.  VITALS: BP 150/82 mmHg  Pulse 61  Ht 5\' 8"  (1.727 m)  Wt 201 lb 6.4 oz (91.354 kg)  BMI 30.63 kg/m2  EXAM: General appearance: alert and no distress Neck: no adenopathy, no carotid bruit, no JVD, supple, symmetrical, trachea midline and thyroid not enlarged, symmetric, no tenderness/mass/nodules Lungs: clear to auscultation bilaterally Heart: regular rate and rhythm, S1, S2 normal, no murmur, click, rub or gallop Abdomen: soft, non-tender; bowel sounds normal; no masses,  no organomegaly Extremities: extremities normal, atraumatic, no cyanosis or edema Pulses: 2+ and symmetric Skin: Skin color, texture, turgor normal. No rashes or lesions Neurologic: Grossly normal  EKG: Normal sinus rhythm at 61  ASSESSMENT: 1. Hypertension-controlled 2. Dyslipidemia - followed by PCP 3. Erectile dysfunction 4. Anxiety 5. Leg edema  PLAN: 1.   Mr. Benegas is doing fairly well with regards to his blood pressure. Unfortunately he's maximized on a number different blood pressure medications. He's having some swelling in his legs which is probably related to amlodipine, however I do not feels blood pressure will be well-controlled if we come off of it. I also found out today that he's only taking his hydralazine once a day. It's recommended that he take this at least twice a day if not 3 times a day, separated every 8 hours. He said he go ahead and try to do that and inform us of his blood pressures. Plan to see him back annually or sooner as necessary.  Pixie Casino, MD, [facility] Attending Cardiologist [facility] 10/28/2014, 6:23 PM

## 2014-10-28 NOTE — Patient Instructions (Signed)
Your physician wants you to follow-up in 1 year with Dr. Debara Pickett. You will receive a reminder letter in the mail 2 months in advance. If you do not receive a letter, please call our office to schedule the follow-up appointment.

## 2015-01-05 ENCOUNTER — Telehealth: Payer: Self-pay | Admitting: Internal Medicine

## 2015-01-05 ENCOUNTER — Other Ambulatory Visit: Payer: Self-pay | Admitting: *Deleted

## 2015-01-05 MED ORDER — SILDENAFIL CITRATE 25 MG PO TABS
25.0000 mg | ORAL_TABLET | Freq: Every day | ORAL | Status: DC | PRN
Start: 2015-01-05 — End: 2015-01-05

## 2015-01-05 MED ORDER — SILDENAFIL CITRATE 100 MG PO TABS: 100.0000 mg | ORAL_TABLET | Freq: Every day | ORAL | Status: DC | PRN

## 2015-01-05 NOTE — Telephone Encounter (Signed)
Spoke with pt, aware refill sent to the pharmacy. 

## 2015-01-05 NOTE — Telephone Encounter (Signed)
°  1. Which medications need to be refilled? Viagra-new prescription  2. Which pharmacy is medication to be sent to? [facility]  3. Do they need a 30 day or 90 day supply? 10 and refills  4. Would they like a call back once the medication has been sent to the pharmacy? yes

## 2015-02-22 ENCOUNTER — Encounter: Payer: Self-pay | Admitting: Internal Medicine

## 2015-09-08 ENCOUNTER — Other Ambulatory Visit: Payer: Self-pay | Admitting: Internal Medicine

## 2015-09-08 NOTE — Telephone Encounter (Signed)
Rx(s) sent to pharmacy electronically.  

## 2015-11-07 ENCOUNTER — Encounter: Payer: Self-pay | Admitting: Internal Medicine

## 2015-11-07 ENCOUNTER — Ambulatory Visit (INDEPENDENT_AMBULATORY_CARE_PROVIDER_SITE_OTHER): Payer: Medicare Other | Admitting: Internal Medicine

## 2015-11-07 VITALS — BP 138/78 | HR 64 | Ht 68.0 in | Wt 205.4 lb

## 2015-11-07 DIAGNOSIS — E785 Hyperlipidemia, unspecified: Secondary | ICD-10-CM

## 2015-11-07 DIAGNOSIS — I10 Essential (primary) hypertension: Secondary | ICD-10-CM

## 2015-11-07 DIAGNOSIS — N528 Other male erectile dysfunction: Secondary | ICD-10-CM | POA: Diagnosis not present

## 2015-11-07 MED ORDER — VALSARTAN-HYDROCHLOROTHIAZIDE 320-25 MG PO TABS: ORAL_TABLET | ORAL | Status: DC

## 2015-11-07 MED ORDER — AMLODIPINE BESYLATE 10 MG PO TABS: ORAL_TABLET | ORAL | Status: DC

## 2015-11-07 MED ORDER — CARVEDILOL 25 MG PO TABS: ORAL_TABLET | ORAL | Status: DC

## 2015-11-07 MED ORDER — HYDRALAZINE HCL 25 MG PO TABS: ORAL_TABLET | ORAL | Status: DC

## 2015-11-07 NOTE — Patient Instructions (Signed)
Your physician wants you to follow-up in: 1 year with Dr. Hilty. You will receive a reminder letter in the mail two months in advance. If you don't receive a letter, please call our office to schedule the follow-up appointment.  Your medications have been refilled.  

## 2015-11-07 NOTE — Progress Notes (Signed)
OFFICE NOTE  Chief Complaint:  Routine office visit   Primary Care Physician: Andria Frames, MD  HPI: Edward SUSTAITA is a 70 year old gentleman with hypertension, erectile dysfunction, mild dyslipidemia. Blood pressures were mildly elevated, however, he was having difficulty affording his previous regimen of Micardis and Tekturna. I had discontinued the Micardis and put him on valsartan/hydrochlorothiazide which is working fairly well and he was interested in coming off the [place] due to cost. I recommended starting him on hydralazine at this time and he is done fairly well with this. He actually is taking hydralazine only once daily and twice if he needs to, but blood pressure control seems to be improved. He is otherwise asymptomatic, no chest pain, shortness of breath, palpitations, presyncope or syncopal symptoms. His other concerns today are that he is requesting refills on his buspirone which I will defer to his primary care provider. He also is interested in a sedan a filled prescription. He believes that he can get the medication cheaper through [facility] in [place].  I saw Edward Bush back in the office today. It's been a couple of years since I seen him. He's had some problems with swelling in his ankles and was switched to clonidine, but had significant dry mouth and then switch back to amlodipine. Unfortunately he's on a number of blood pressure medications and doses and requires his medicines to maintain normal blood pressures. He has no cardiac complaints, specifically denying any shortness of breath or chest pain. EKG today shows normal sinus rhythm at 61.  Edward Bush returns today for follow-up. He reports doing well over the past year. His swelling is improved significantly, probably due to cooler weather not being on his feet as much. Blood pressure is been pretty well-controlled although he is only taking the hydralazine once a day. I suspect that he may be having  afternoon rises in blood pressure that is not aware of. I advised him to take the medicine at least twice a day, or more ideally 3 times a day. He says this is difficult to remember, which I understand. He denies any chest pain or worsening shortness of breath.  PMHx:  Past Medical History  Diagnosis Date  . Hypertension   . Hyperlipemia   . ED (erectile dysfunction)   . History of nuclear stress test 06/24/2006    exercise myoview; normal pattern of perfusion; low risk scan     Past Surgical History  Procedure Laterality Date  . Hernia repair    . Transthoracic echocardiogram  04/29/2011    EF>55%; mild concentric LVH; LA mod dilated; trace MR/trace TR,/trace pulm valve regurg;     FAMHx:  Family History  Problem Relation Age of Onset  . Hypertension Mother   . Hypertension Maternal Grandmother   . Cancer Maternal Grandmother   . Cancer - Lung Maternal Grandfather   . Hypertension Brother     x2  . Cancer Brother     kidney cancer    SOCHx:   reports that he has never smoked. He has never used smokeless tobacco. He reports that he does not drink alcohol or use illicit drugs.  ALLERGIES:  Allergies  Allergen Reactions  . Sulfa Antibiotics Rash    ROS: Pertinent items noted in HPI and remainder of comprehensive ROS otherwise negative.  HOME MEDS: Current Outpatient Prescriptions  Medication Sig Dispense Refill  . amLODipine (NORVASC) 10 MG tablet Take 1 tablet by mouth (10mg ) daily 90 tablet 3  . aspirin EC  81 MG tablet Take 81 mg by mouth daily.      . carvedilol (COREG) 25 MG tablet Take 1 tablet by mouth (25mg ) two times daily 180 tablet 3  . Chlorpheniramine-Acetaminophen (CORICIDIN HBP COLD/FLU PO) Take by mouth as needed.    . hydrALAZINE (APRESOLINE) 25 MG tablet Take 1 tablet by mouth (25mg ) 3 times daily 270 tablet 3  . Potassium 95 MG TABS Take 1 tablet by mouth daily.      . sildenafil (VIAGRA) 100 MG tablet Take 1 tablet (100 mg total) by mouth daily as  needed for erectile dysfunction. 10 tablet 6  . valsartan-hydrochlorothiazide (DIOVAN-HCT) 320-25 MG tablet Take 1 tablet by mouth daily 90 tablet 3   No current facility-administered medications for this visit.    LABS/IMAGING: No results found for this or any previous visit (from the past 48 hour(s)). No results found.  VITALS: BP 138/78 mmHg  Pulse 64  Ht 5\' 8"  (1.727 m)  Wt 205 lb 6.4 oz (93.169 kg)  BMI 31.24 kg/m2  EXAM: General appearance: alert and no distress Neck: no adenopathy, no carotid bruit, no JVD, supple, symmetrical, trachea midline and thyroid not enlarged, symmetric, no tenderness/mass/nodules Lungs: clear to auscultation bilaterally Heart: regular rate and rhythm, S1, S2 normal, no murmur, click, rub or gallop Abdomen: soft, non-tender; bowel sounds normal; no masses,  no organomegaly Extremities: extremities normal, atraumatic, no cyanosis or edema Pulses: 2+ and symmetric Skin: Skin color, texture, turgor normal. No rashes or lesions Neurologic: Grossly normal  EKG: Normal sinus rhythm at 64  ASSESSMENT: 1. Hypertension-controlled 2. Dyslipidemia - followed by PCP 3. Erectile dysfunction 4. Anxiety 5. Leg edema  PLAN: 1.   Edward Bush seems to be doing generally well. He denies any chest pain or worsening shortness of breath. His blood pressure is well-controlled today. He is only taking hydralazine once a day and I encouraged him to try to take it at least twice a day because of the short acting nature of the medicine. His cholesterol is followed by his primary care provider but is been controlled. He reports some improvement in erectile dysfunction with Viagra. His anxiety is generally well controlled. He is not having any current leg swelling.  Follow-up annually or sooner as necessary.  Pixie Casino, MD, [facility] Attending Cardiologist [place] C Hilty 11/07/2015, 10:29 AM

## 2015-11-15 NOTE — Addendum Note (Signed)
Addended by: Therisa Doyne on: 11/15/2015 05:00 PM   Modules accepted: Orders

## 2016-09-02 ENCOUNTER — Other Ambulatory Visit: Payer: Self-pay | Admitting: Internal Medicine

## 2016-09-02 NOTE — Telephone Encounter (Signed)
Rx(s) sent to pharmacy electronically.  

## 2016-11-07 ENCOUNTER — Encounter: Payer: Self-pay | Admitting: Internal Medicine

## 2016-11-07 ENCOUNTER — Ambulatory Visit (INDEPENDENT_AMBULATORY_CARE_PROVIDER_SITE_OTHER): Payer: Medicare Other | Admitting: Internal Medicine

## 2016-11-07 VITALS — BP 130/76 | HR 61 | Ht 68.0 in | Wt 204.6 lb

## 2016-11-07 DIAGNOSIS — Z13220 Encounter for screening for lipoid disorders: Secondary | ICD-10-CM | POA: Diagnosis not present

## 2016-11-07 DIAGNOSIS — N528 Other male erectile dysfunction: Secondary | ICD-10-CM | POA: Diagnosis not present

## 2016-11-07 DIAGNOSIS — I10 Essential (primary) hypertension: Secondary | ICD-10-CM | POA: Diagnosis not present

## 2016-11-07 DIAGNOSIS — Z79899 Other long term (current) drug therapy: Secondary | ICD-10-CM | POA: Diagnosis not present

## 2016-11-07 MED ORDER — VALSARTAN-HYDROCHLOROTHIAZIDE 320-25 MG PO TABS: 1.0000 | ORAL_TABLET | Freq: Every day | ORAL | 3 refills | Status: DC

## 2016-11-07 MED ORDER — AMLODIPINE BESYLATE 10 MG PO TABS: 10.0000 mg | ORAL_TABLET | Freq: Every day | ORAL | 3 refills | Status: DC

## 2016-11-07 MED ORDER — CARVEDILOL 25 MG PO TABS: 25.0000 mg | ORAL_TABLET | Freq: Two times a day (BID) | ORAL | 3 refills | Status: DC

## 2016-11-07 MED ORDER — HYDRALAZINE HCL 25 MG PO TABS: 25.0000 mg | ORAL_TABLET | Freq: Two times a day (BID) | ORAL | 3 refills | Status: DC

## 2016-11-07 NOTE — Patient Instructions (Signed)
Your physician recommends that you return for lab work FASTING @ [facility]. [place] have been referred to Dr. Louis Meckel with [facility]   Your physician wants you to follow-up in: ONE YEAR with Dr. Debara Pickett. You will receive a reminder letter in the mail two months in advance. If you don't receive a letter, please call our office to schedule the follow-up appointment.

## 2016-11-07 NOTE — Progress Notes (Signed)
OFFICE NOTE  Chief Complaint:  Routine office visit, problems with ED  Primary Care Physician: Andria Frames, MD  HPI: Edward Bush is a 71 year old gentleman with hypertension, erectile dysfunction, mild dyslipidemia. Blood pressures were mildly elevated, however, he was having difficulty affording his previous regimen of Micardis and Tekturna. I had discontinued the Micardis and put him on valsartan/hydrochlorothiazide which is working fairly well and he was interested in coming off the [place] due to cost. I recommended starting him on hydralazine at this time and he is done fairly well with this. He actually is taking hydralazine only once daily and twice if he needs to, but blood pressure control seems to be improved. He is otherwise asymptomatic, no chest pain, shortness of breath, palpitations, presyncope or syncopal symptoms. His other concerns today are that he is requesting refills on his buspirone which I will defer to his primary care provider. He also is interested in a sedan a filled prescription. He believes that he can get the medication cheaper through [facility] in [place].  I saw Edward Bush back in the office today. It's been a couple of years since I seen him. He's had some problems with swelling in his ankles and was switched to clonidine, but had significant dry mouth and then switch back to amlodipine. Unfortunately he's on a number of blood pressure medications and doses and requires his medicines to maintain normal blood pressures. He has no cardiac complaints, specifically denying any shortness of breath or chest pain. EKG today shows normal sinus rhythm at 61.  Edward Bush returns today for follow-up. He reports doing well over the past year. His swelling is improved significantly, probably due to cooler weather not being on his feet as much. Blood pressure is been pretty well-controlled although he is only taking the hydralazine once a day. I suspect that he may  be having afternoon rises in blood pressure that is not aware of. I advised him to take the medicine at least twice a day, or more ideally 3 times a day. He says this is difficult to remember, which I understand. He denies any chest pain or worsening shortness of breath.  11/07/2016  Edward Bush returns today for follow-up. He reports that he feels very well. He denies any chest pain or shortness of breath. Blood pressure initially was 152/84. Recheck came down to 130/76. He's been compliant with medications although cannot take the hydralazine 3 times a day due to difficulty remembering it. He generally takes the 25 mg tablet twice daily. He was also placed on sildenafil for erectile dysfunction although being on the 100 mg tablet over the past 6-8 months he notes that he has not been able to sustain erections although he can achieve them. Previously he had no difficulty with the medication.  PMHx:  Past Medical History:  Diagnosis Date  . ED (erectile dysfunction)   . History of nuclear stress test 06/24/2006   exercise myoview; normal pattern of perfusion; low risk scan   . Hyperlipemia   . Hypertension     Past Surgical History:  Procedure Laterality Date  . HERNIA REPAIR    . TRANSTHORACIC ECHOCARDIOGRAM  04/29/2011   EF>55%; mild concentric LVH; LA mod dilated; trace MR/trace TR,/trace pulm valve regurg;     FAMHx:  Family History  Problem Relation Age of Onset  . Hypertension Mother   . Hypertension Maternal Grandmother   . Cancer Maternal Grandmother   . Cancer - Lung Maternal Grandfather   .  Hypertension Brother     x2  . Cancer Brother     kidney cancer    SOCHx:   reports that he has never smoked. He has never used smokeless tobacco. He reports that he does not drink alcohol or use drugs.  ALLERGIES:  Allergies  Allergen Reactions  . Sulfa Antibiotics Rash    ROS: Pertinent items noted in HPI and remainder of comprehensive ROS otherwise negative.  HOME  MEDS: Current Outpatient Prescriptions  Medication Sig Dispense Refill  . amLODipine (NORVASC) 10 MG tablet Take 1 tablet (10 mg total) by mouth daily. 90 tablet 3  . aspirin EC 81 MG tablet Take 81 mg by mouth daily.      . carvedilol (COREG) 25 MG tablet Take 1 tablet (25 mg total) by mouth 2 (two) times daily. 180 tablet 3  . hydrALAZINE (APRESOLINE) 25 MG tablet Take 1 tablet (25 mg total) by mouth 2 (two) times daily. 180 tablet 3  . Potassium 95 MG TABS Take 1 tablet by mouth daily.      . sildenafil (VIAGRA) 100 MG tablet Take 1 tablet (100 mg total) by mouth daily as needed for erectile dysfunction. 10 tablet 6  . valsartan-hydrochlorothiazide (DIOVAN-HCT) 320-25 MG tablet Take 1 tablet by mouth daily. 90 tablet 3   No current facility-administered medications for this visit.     LABS/IMAGING: No results found for this or any previous visit (from the past 48 hour(s)). No results found.  VITALS: BP 130/76   Pulse 61   Ht 5\' 8"  (1.727 Edward Bush)   Wt 204 lb 9.6 oz (92.8 kg)   BMI 31.11 kg/Edward Bush   EXAM: General appearance: alert and no distress Neck: no adenopathy, no carotid bruit, no JVD, supple, symmetrical, trachea midline and thyroid not enlarged, symmetric, no tenderness/mass/nodules Lungs: clear to auscultation bilaterally Heart: regular rate and rhythm, S1, S2 normal, no murmur, click, rub or gallop Abdomen: soft, non-tender; bowel sounds normal; no masses,  no organomegaly Extremities: extremities normal, atraumatic, no cyanosis or edema Pulses: 2+ and symmetric Skin: Skin color, texture, turgor normal. No rashes or lesions Neurologic: Grossly normal  EKG: Normal sinus rhythm at 61, nonspecific IVCD  ASSESSMENT: 1. Hypertension-controlled 2. Dyslipidemia - followed by PCP 3. Erectile dysfunction 4. Anxiety 5. Leg edema  PLAN: 1.   Edward Bush has good control over his blood pressure on his current regimen. He can continue to take the hydralazine only twice daily. Low  cholesterol been followed by his PCP and has not been assessed recently and he is not on therapy. I like to get a fasting lipid profile and see if we potentially could consider treating him. His predicted 10 year cardiovascular disease risk is likely elevated given her age, hypertension and family history.  With regards to recurrent problems of erectile dysfunction, I would advise a referral to urologist which we've placed today.   Follow-up annually or sooner as necessary.   Edward Casino, MD, [facility] Attending Cardiologist [place] [facility] Uniqua Kihn 11/07/2016, 1:21 PM

## 2016-11-13 LAB — COMPREHENSIVE METABOLIC PANEL
A/G RATIO: 1.4 (ref 1.2–2.2)
ALK PHOS: 61 IU/L (ref 39–117)
ALT: 36 IU/L (ref 0–44)
AST: 29 IU/L (ref 0–40)
Albumin: 4.3 g/dL (ref 3.5–4.8)
BILIRUBIN TOTAL: 1.4 mg/dL — AB (ref 0.0–1.2)
BUN/Creatinine Ratio: 21 (ref 10–24)
BUN: 19 mg/dL (ref 8–27)
CHLORIDE: 100 mmol/L (ref 96–106)
CO2: 26 mmol/L (ref 18–29)
Calcium: 9.1 mg/dL (ref 8.6–10.2)
Creatinine, Ser: 0.92 mg/dL (ref 0.76–1.27)
GFR calc Af Amer: 96 mL/min/{1.73_m2} (ref >59)
GFR calc non Af Amer: 83 mL/min/{1.73_m2} (ref >59)
Globulin, Total: 3 g/dL (ref 1.5–4.5)
Glucose: 100 mg/dL — ABNORMAL HIGH (ref 65–99)
POTASSIUM: 4.1 mmol/L (ref 3.5–5.2)
SODIUM: 141 mmol/L (ref 134–144)
Total Protein: 7.3 g/dL (ref 6.0–8.5)

## 2016-11-13 LAB — LIPID PANEL
Chol/HDL Ratio: 5.2 ratio units — ABNORMAL HIGH (ref 0.0–5.0)
Cholesterol, Total: 165 mg/dL (ref 100–199)
HDL: 32 mg/dL — AB (ref >39)
LDL Calculated: 91 mg/dL (ref 0–99)
TRIGLYCERIDES: 209 mg/dL — AB (ref 0–149)
VLDL Cholesterol Cal: 42 mg/dL — ABNORMAL HIGH (ref 5–40)

## 2016-11-14 ENCOUNTER — Encounter: Payer: Self-pay | Admitting: Internal Medicine

## 2017-01-14 DIAGNOSIS — I10 Essential (primary) hypertension: Secondary | ICD-10-CM | POA: Diagnosis not present

## 2017-01-14 DIAGNOSIS — Z0001 Encounter for general adult medical examination with abnormal findings: Secondary | ICD-10-CM | POA: Diagnosis not present

## 2017-01-14 DIAGNOSIS — Z125 Encounter for screening for malignant neoplasm of prostate: Secondary | ICD-10-CM | POA: Diagnosis not present

## 2017-01-21 DIAGNOSIS — Z1211 Encounter for screening for malignant neoplasm of colon: Secondary | ICD-10-CM | POA: Diagnosis not present

## 2017-01-21 DIAGNOSIS — Z1212 Encounter for screening for malignant neoplasm of rectum: Secondary | ICD-10-CM | POA: Diagnosis not present

## 2017-02-04 ENCOUNTER — Encounter: Payer: Self-pay | Admitting: Internal Medicine

## 2017-03-19 ENCOUNTER — Ambulatory Visit (AMBULATORY_SURGERY_CENTER): Payer: Self-pay

## 2017-03-19 ENCOUNTER — Encounter: Payer: Self-pay | Admitting: Internal Medicine

## 2017-03-19 VITALS — Ht 68.0 in | Wt 202.0 lb

## 2017-03-19 DIAGNOSIS — R195 Other fecal abnormalities: Secondary | ICD-10-CM

## 2017-03-19 NOTE — Progress Notes (Signed)
No allergies to eggs or soy No diet meds No home oxygen No past problem with anesthesia  Registered emmi

## 2017-04-02 ENCOUNTER — Encounter: Payer: Self-pay | Admitting: Internal Medicine

## 2017-04-02 ENCOUNTER — Ambulatory Visit (AMBULATORY_SURGERY_CENTER): Payer: Medicare Other | Admitting: Internal Medicine

## 2017-04-02 VITALS — BP 124/75 | HR 60 | Temp 95.0°F | Resp 18 | Ht 68.0 in | Wt 204.0 lb

## 2017-04-02 DIAGNOSIS — D122 Benign neoplasm of ascending colon: Secondary | ICD-10-CM

## 2017-04-02 DIAGNOSIS — D123 Benign neoplasm of transverse colon: Secondary | ICD-10-CM | POA: Diagnosis not present

## 2017-04-02 DIAGNOSIS — R195 Other fecal abnormalities: Secondary | ICD-10-CM | POA: Diagnosis present

## 2017-04-02 MED ORDER — SODIUM CHLORIDE 0.9 % IV SOLN: 500.0000 mL | INTRAVENOUS | Status: AC

## 2017-04-02 NOTE — Progress Notes (Signed)
Pt's states no medical or surgical changes since previsit or office visit. 

## 2017-04-02 NOTE — Patient Instructions (Addendum)
   I found and removed 2 small polyps. I will let you know pathology results and when/if to have another routine colonoscopy by mail and/or My Chart.  I appreciate the opportunity to care for you. Gatha Mayer, MD, FACG  YOU HAD AN ENDOSCOPIC PROCEDURE TODAY AT [facility]:   Refer to the procedure report that was given to you for any specific questions about what was found during the examination.  If the procedure report does not answer your questions, please call your gastroenterologist to clarify.  If you requested that your care partner not be given the details of your procedure findings, then the procedure report has been included in a sealed envelope for you to review at your convenience later.  YOU SHOULD EXPECT: Some feelings of bloating in the abdomen. Passage of more gas than usual.  Walking can help get rid of the air that was put into your GI tract during the procedure and reduce the bloating. If you had a lower endoscopy (such as a colonoscopy or flexible sigmoidoscopy) you may notice spotting of blood in your stool or on the toilet paper. If you underwent a bowel prep for your procedure, you may not have a normal bowel movement for a few days.  Please Note:  You might notice some irritation and congestion in your nose or some drainage.  This is from the oxygen used during your procedure.  There is no need for concern and it should clear up in a day or so.  SYMPTOMS TO REPORT IMMEDIATELY:   Following lower endoscopy (colonoscopy or flexible sigmoidoscopy):  Excessive amounts of blood in the stool  Significant tenderness or worsening of abdominal pains  Swelling of the abdomen that is new, acute  Fever of 100F or higher   For urgent or emergent issues, a gastroenterologist can be reached at any hour by calling [phone].   DIET:  We do recommend a small meal at first, but then you may proceed to your regular diet.  Drink plenty of fluids but you  should avoid alcoholic beverages for 24 hours.  ACTIVITY:  You should plan to take it easy for the rest of today and you should NOT DRIVE or use heavy machinery until tomorrow (because of the sedation medicines used during the test).    FOLLOW UP: Our staff will call the number listed on your records the next business day following your procedure to check on you and address any questions or concerns that you may have regarding the information given to you following your procedure. If we do not reach you, we will leave a message.  However, if you are feeling well and you are not experiencing any problems, there is no need to return our call.  We will assume that you have returned to your regular daily activities without incident.  If any biopsies were taken you will be contacted by phone or by letter within the next 1-3 weeks.  Please call us at [phone] if you have not heard about the biopsies in 3 weeks.    SIGNATURES/CONFIDENTIALITY: You and/or your care partner have signed paperwork which will be entered into your electronic medical record.  These signatures attest to the fact that that the information above on your After Visit Summary has been reviewed and is understood.  Full responsibility of the confidentiality of this discharge information lies with you and/or your care-partner.   [place]yp information given

## 2017-04-02 NOTE — Op Note (Signed)
[facility] Patient Name: Edward Bush Procedure Date: 04/02/2017 2:23 PM MRN: [id number] Endoscopist: Gatha Mayer , MD Age: 71 Referring MD:  Date of Birth: August 26, 1945 Gender: Male Account #: [id number] Procedure:                Colonoscopy Indications:              Positive Cologuard test Medicines:                Propofol per Anesthesia, Monitored Anesthesia Care Procedure:                Pre-Anesthesia Assessment:                           - Prior to the procedure, a History and Physical                            was performed, and patient medications and                            allergies were reviewed. The patient's tolerance of                            previous anesthesia was also reviewed. The risks                            and benefits of the procedure and the sedation                            options and risks were discussed with the patient.                            All questions were answered, and informed consent                            was obtained. Prior Anticoagulants: The patient has                            taken no previous anticoagulant or antiplatelet                            agents. ASA Grade Assessment: II - A patient with                            mild systemic disease. After reviewing the risks                            and benefits, the patient was deemed in                            satisfactory condition to undergo the procedure.                           After obtaining informed consent, the colonoscope  was passed under direct vision. Throughout the                            procedure, the patient's blood pressure, pulse, and                            oxygen saturations were monitored continuously. The                            Model CF-HQ190L [id number]) scope was introduced                            through the anus and advanced to the the cecum,                            identified by  appendiceal orifice and ileocecal                            valve. The colonoscopy was performed without                            difficulty. The patient tolerated the procedure                            well. The quality of the bowel preparation was                            good. The bowel preparation used was Miralax. The                            ileocecal valve, appendiceal orifice, and rectum                            were photographed. Scope In: 2:26:04 PM Scope Out: 2:41:02 PM Scope Withdrawal Time: 0 hours 12 minutes 12 seconds  Total Procedure Duration: 0 hours 14 minutes 58 seconds  Findings:                 The perianal and digital rectal examinations were                            normal. Pertinent negatives include normal prostate                            (size, shape, and consistency).                           Two sessile polyps were found in the transverse                            colon and ascending colon. The polyps were 4 to 7                            mm in size. These polyps were removed with a cold  snare. Resection and retrieval were complete.                            Verification of patient identification for the                            specimen was done. Estimated blood loss was minimal.                           The exam was otherwise without abnormality on                            direct and retroflexion views. Complications:            No immediate complications. Estimated Blood Loss:     Estimated blood loss was minimal. Impression:               - Two 4 to 7 mm polyps in the transverse colon and                            in the ascending colon, removed with a cold snare.                            Resected and retrieved.                           - The examination was otherwise normal on direct                            and retroflexion views. Recommendation:           - Patient has a contact number available  for                            emergencies. The signs and symptoms of potential                            delayed complications were discussed with the                            patient. Return to normal activities tomorrow.                            Written discharge instructions were provided to the                            patient.                           - Resume previous diet.                           - Continue present medications.                           - Repeat colonoscopy is recommended. The  colonoscopy date will be determined after pathology                            results from today's exam become available for                            review. Gatha Mayer, MD 04/02/2017 2:44:54 PM This report has been signed electronically.

## 2017-04-02 NOTE — Progress Notes (Signed)
Report to PACU, RN, vss, BBS= Clear.  

## 2017-04-02 NOTE — Progress Notes (Signed)
Called to room to assist during endoscopic procedure.  Patient ID and intended procedure confirmed with present staff. Received instructions for my participation in the procedure from the performing physician.  

## 2017-04-03 ENCOUNTER — Telehealth: Payer: Self-pay

## 2017-04-03 NOTE — Telephone Encounter (Signed)
  Follow up Call-  Call back number 04/02/2017  Post procedure Call Back phone  # [phone]  Permission to leave phone message Yes  Some recent data might be hidden     Patient questions:  Do you have a fever, pain , or abdominal swelling? No. Pain Score  0 *  Have you tolerated food without any problems? Yes.    Have you been able to return to your normal activities? Yes.    Do you have any questions about your discharge instructions: Diet   No. Medications  No. Follow up visit  No.  Do you have questions or concerns about your Care? No.  Actions: * If pain score is 4 or above: No action needed, pain <4.  No problems noted per pt. maw

## 2017-04-08 ENCOUNTER — Encounter: Payer: Self-pay | Admitting: Internal Medicine

## 2017-04-08 DIAGNOSIS — Z86010 Personal history of colonic polyps: Secondary | ICD-10-CM

## 2017-04-08 HISTORY — DX: Personal history of colonic polyps: Z86.010

## 2017-04-08 NOTE — Progress Notes (Signed)
2 adenomas max 7 mm Consider repeating colonoscopy in 5 yrs - will be 80 so might be aged out then My Chart letter

## 2017-04-22 DIAGNOSIS — H524 Presbyopia: Secondary | ICD-10-CM | POA: Diagnosis not present

## 2017-04-22 DIAGNOSIS — H40013 Open angle with borderline findings, low risk, bilateral: Secondary | ICD-10-CM | POA: Diagnosis not present

## 2017-08-12 ENCOUNTER — Other Ambulatory Visit: Payer: Self-pay | Admitting: Internal Medicine

## 2017-08-13 NOTE — Telephone Encounter (Signed)
REFILL 

## 2017-10-14 ENCOUNTER — Telehealth: Payer: Self-pay | Admitting: Internal Medicine

## 2017-10-14 MED ORDER — AMLODIPINE BESYLATE 10 MG PO TABS: 10.0000 mg | ORAL_TABLET | Freq: Every day | ORAL | 0 refills | Status: DC

## 2017-10-14 MED ORDER — CARVEDILOL 25 MG PO TABS: 25.0000 mg | ORAL_TABLET | Freq: Two times a day (BID) | ORAL | 0 refills | Status: DC

## 2017-10-14 MED ORDER — HYDRALAZINE HCL 25 MG PO TABS: 25.0000 mg | ORAL_TABLET | Freq: Two times a day (BID) | ORAL | 0 refills | Status: DC

## 2017-10-14 MED ORDER — VALSARTAN-HYDROCHLOROTHIAZIDE 320-25 MG PO TABS: 1.0000 | ORAL_TABLET | Freq: Every day | ORAL | 0 refills | Status: DC

## 2017-10-14 NOTE — Telephone Encounter (Signed)
°*  STAT* If patient is at the pharmacy, call can be transferred to refill team.   1. Which medications need to be refilled? (please list name of each medication and dose if known) Amlodipine 10 mg, Carvedilol 25 mg, Valsartan-hydrochlorothiazide 320-25mg , Hydralazine 25mg   2. Which pharmacy/location (including street and city if local pharmacy) is medication to be sent to?[facility], [place]  3. Do they need a 30 day or 90 day supply? 90  Patient has an appointment on 12-17-17 @ 9:00am with Dr.Hilty.

## 2017-12-02 ENCOUNTER — Other Ambulatory Visit: Payer: Self-pay | Admitting: Internal Medicine

## 2017-12-02 NOTE — Telephone Encounter (Signed)
Rx has been sent to the pharmacy electronically. ° °

## 2017-12-17 ENCOUNTER — Ambulatory Visit: Payer: Medicare Other | Admitting: Internal Medicine

## 2017-12-17 ENCOUNTER — Encounter: Payer: Self-pay | Admitting: Internal Medicine

## 2017-12-17 VITALS — BP 130/82 | HR 58 | Ht 68.0 in | Wt 203.6 lb

## 2017-12-17 DIAGNOSIS — I10 Essential (primary) hypertension: Secondary | ICD-10-CM

## 2017-12-17 MED ORDER — AMLODIPINE BESYLATE 10 MG PO TABS: 10.0000 mg | ORAL_TABLET | Freq: Every day | ORAL | 3 refills | Status: DC

## 2017-12-17 MED ORDER — VALSARTAN-HYDROCHLOROTHIAZIDE 320-25 MG PO TABS: 1.0000 | ORAL_TABLET | Freq: Every day | ORAL | 3 refills | Status: DC

## 2017-12-17 MED ORDER — CARVEDILOL 25 MG PO TABS: 25.0000 mg | ORAL_TABLET | Freq: Two times a day (BID) | ORAL | 3 refills | Status: DC

## 2017-12-17 MED ORDER — HYDRALAZINE HCL 25 MG PO TABS: 25.0000 mg | ORAL_TABLET | Freq: Two times a day (BID) | ORAL | 3 refills | Status: DC

## 2017-12-17 NOTE — Patient Instructions (Signed)
Your physician wants you to follow-up in: ONE YEAR with Dr. Hilty. You will receive a reminder letter in the mail two months in advance. If you don't receive a letter, please call our office to schedule the follow-up appointment.  

## 2017-12-17 NOTE — Progress Notes (Signed)
OFFICE NOTE  Chief Complaint:  No complaints  Primary Care Physician: Deland Pretty, MD  HPI: Edward Bush is a 72 year old gentleman with hypertension, erectile dysfunction, mild dyslipidemia. Blood pressures were mildly elevated, however, he was having difficulty affording his previous regimen of Micardis and Tekturna. I had discontinued the Micardis and put him on valsartan/hydrochlorothiazide which is working fairly well and he was interested in coming off the [place] due to cost. I recommended starting him on hydralazine at this time and he is done fairly well with this. He actually is taking hydralazine only once daily and twice if he needs to, but blood pressure control seems to be improved. He is otherwise asymptomatic, no chest pain, shortness of breath, palpitations, presyncope or syncopal symptoms. His other concerns today are that he is requesting refills on his buspirone which I will defer to his primary care provider. He also is interested in a sedan a filled prescription. He believes that he can get the medication cheaper through [facility] in [place].  I saw Edward Bush back in the office today. It's been a couple of years since I seen him. He's had some problems with swelling in his ankles and was switched to clonidine, but had significant dry mouth and then switch back to amlodipine. Unfortunately he's on a number of blood pressure medications and doses and requires his medicines to maintain normal blood pressures. He has no cardiac complaints, specifically denying any shortness of breath or chest pain. EKG today shows normal sinus rhythm at 61.  Edward Bush returns today for follow-up. He reports doing well over the past year. His swelling is improved significantly, probably due to cooler weather not being on his feet as much. Blood pressure is been pretty well-controlled although he is only taking the hydralazine once a day. I suspect that he may be having afternoon rises in  blood pressure that is not aware of. I advised him to take the medicine at least twice a day, or more ideally 3 times a day. He says this is difficult to remember, which I understand. He denies any chest pain or worsening shortness of breath.  11/07/2016  Edward Bush returns today for follow-up. He reports that he feels very well. He denies any chest pain or shortness of breath. Blood pressure initially was 152/84. Recheck came down to 130/76. He's been compliant with medications although cannot take the hydralazine 3 times a day due to difficulty remembering it. He generally takes the 25 mg tablet twice daily. He was also placed on sildenafil for erectile dysfunction although being on the 100 mg tablet over the past 6-8 months he notes that he has not been able to sustain erections although he can achieve them. Previously he had no difficulty with the medication.  12/17/2017  Edward Bush was seen today follow-up.  Overall he is doing great.  Blood pressure remains well controlled.  He denies any chest pain or worsening shortness of breath.  He could stand to lose a little bit of weight.  He says he has not been as physically active as they have been taking care of his father-in-law.  They expect to have more time in the near future to get in some more exercise.  PMHx:  Past Medical History:  Diagnosis Date  . ED (erectile dysfunction)   . History of nuclear stress test 06/24/2006   exercise myoview; normal pattern of perfusion; low risk scan   . Hx of adenomatous colonic polyps 04/08/2017  .  Hyperlipemia   . Hypertension     Past Surgical History:  Procedure Laterality Date  . HERNIA REPAIR     x2  . TRANSTHORACIC ECHOCARDIOGRAM  04/29/2011   EF>55%; mild concentric LVH; LA mod dilated; trace MR/trace TR,/trace pulm valve regurg;     FAMHx:  Family History  Problem Relation Age of Onset  . Hypertension Maternal Grandmother   . Cancer Maternal Grandmother   . Cancer - Lung Maternal Grandfather    . Hypertension Mother   . Hypertension Brother        x2  . Cancer Brother        kidney cancer  . Colon cancer Neg Hx     SOCHx:   reports that he has never smoked. He has never used smokeless tobacco. He reports that he does not drink alcohol or use drugs.  ALLERGIES:  Allergies  Allergen Reactions  . Sulfa Antibiotics Rash    ROS: Pertinent items noted in HPI and remainder of comprehensive ROS otherwise negative.  HOME MEDS: Current Outpatient Medications  Medication Sig Dispense Refill  . amLODipine (NORVASC) 10 MG tablet TAKE 1 TABLET BY MOUTH  DAILY (KEEP OFFICE VISIT) 90 tablet 0  . aspirin EC 81 MG tablet Take 81 mg by mouth daily.      . carvedilol (COREG) 25 MG tablet TAKE 1 TABLET BY MOUTH 2  TIMES DAILY (KEEP OFFICE  VISIT) 180 tablet 0  . hydrALAZINE (APRESOLINE) 25 MG tablet TAKE 1 TABLET BY MOUTH 2  TIMES DAILY (KEEP OFFICE  VISIT) 180 tablet 0  . POTASSIUM PO Take 1 tablet by mouth daily. 595mg  qd    . sildenafil (VIAGRA) 100 MG tablet Take 1 tablet (100 mg total) by mouth daily as needed for erectile dysfunction. 10 tablet 6  . valsartan-hydrochlorothiazide (DIOVAN-HCT) 320-25 MG tablet TAKE 1 TABLET BY MOUTH  DAILY. (KEEP OFFICE VISIT) 90 tablet 0   Current Facility-Administered Medications  Medication Dose Route Frequency Provider Last Rate Last Dose  . 0.9 %  sodium chloride infusion  500 mL Intravenous Continuous Gatha Mayer, MD        LABS/IMAGING: No results found for this or any previous visit (from the past 48 hour(s)). No results found.  VITALS: BP 130/82   Pulse (!) 58   Ht 5\' 8"  (1.727 m)   Wt 203 lb 9.6 oz (92.4 kg)   BMI 30.96 kg/m   EXAM: General appearance: alert and no distress Neck: no adenopathy, no carotid bruit, no JVD, supple, symmetrical, trachea midline and thyroid not enlarged, symmetric, no tenderness/mass/nodules Lungs: clear to auscultation bilaterally Heart: regular rate and rhythm, S1, S2 normal, no murmur, click,  rub or gallop Abdomen: soft, non-tender; bowel sounds normal; no masses,  no organomegaly Extremities: extremities normal, atraumatic, no cyanosis or edema Pulses: 2+ and symmetric Skin: Skin color, texture, turgor normal. No rashes or lesions Neurologic: Grossly normal  EKG: Sinus bradycardia 58, LAFB-personally reviewed  ASSESSMENT: 1. Hypertension-controlled 2. Dyslipidemia - followed by PCP 3. Erectile dysfunction 4. Anxiety 5. Leg edema  PLAN: 1.   Edward Bush continues to have good control of her his blood pressure.  He says he has been on blood pressure medicine since he was 16.  Fortunately done well without events.  Cholesterol is been followed by primary care provider and is controlled.  He needs to continue to work on more physical activity in a mild amount of weight loss.  Overall he is doing well.  Follow-up annually or  sooner as necessary.  Pixie Casino, MD, [facility], [place] Director of the Advanced Lipid Disorders &  Cardiovascular Risk Reduction Clinic Diplomate of the American Board of Clinical Lipidology Attending Cardiologist  Direct Dial: [phone]  Fax: [phone]  Website:  [email or web address] 12/17/2017, 9:48 AM

## 2018-02-26 DIAGNOSIS — Z125 Encounter for screening for malignant neoplasm of prostate: Secondary | ICD-10-CM | POA: Diagnosis not present

## 2018-02-26 DIAGNOSIS — Z7982 Long term (current) use of aspirin: Secondary | ICD-10-CM | POA: Diagnosis not present

## 2018-02-26 DIAGNOSIS — I10 Essential (primary) hypertension: Secondary | ICD-10-CM | POA: Diagnosis not present

## 2018-03-03 DIAGNOSIS — I10 Essential (primary) hypertension: Secondary | ICD-10-CM | POA: Diagnosis not present

## 2018-03-03 DIAGNOSIS — H6120 Impacted cerumen, unspecified ear: Secondary | ICD-10-CM | POA: Diagnosis not present

## 2018-03-03 DIAGNOSIS — Z1212 Encounter for screening for malignant neoplasm of rectum: Secondary | ICD-10-CM | POA: Diagnosis not present

## 2018-03-03 DIAGNOSIS — Z0001 Encounter for general adult medical examination with abnormal findings: Secondary | ICD-10-CM | POA: Diagnosis not present

## 2018-05-07 DIAGNOSIS — H5213 Myopia, bilateral: Secondary | ICD-10-CM | POA: Diagnosis not present

## 2018-09-04 DIAGNOSIS — I10 Essential (primary) hypertension: Secondary | ICD-10-CM | POA: Diagnosis not present

## 2018-09-04 DIAGNOSIS — H6120 Impacted cerumen, unspecified ear: Secondary | ICD-10-CM | POA: Diagnosis not present

## 2018-10-03 ENCOUNTER — Other Ambulatory Visit: Payer: Self-pay | Admitting: Internal Medicine

## 2018-10-08 ENCOUNTER — Other Ambulatory Visit: Payer: Self-pay | Admitting: Internal Medicine

## 2018-11-10 DIAGNOSIS — L858 Other specified epidermal thickening: Secondary | ICD-10-CM | POA: Diagnosis not present

## 2018-11-10 DIAGNOSIS — L711 Rhinophyma: Secondary | ICD-10-CM | POA: Diagnosis not present

## 2018-12-28 ENCOUNTER — Telehealth: Payer: Self-pay | Admitting: *Deleted

## 2018-12-28 NOTE — Telephone Encounter (Signed)
A message was left, re: follow up visit. 

## 2019-03-04 DIAGNOSIS — H40013 Open angle with borderline findings, low risk, bilateral: Secondary | ICD-10-CM | POA: Diagnosis not present

## 2019-03-08 DIAGNOSIS — Z7982 Long term (current) use of aspirin: Secondary | ICD-10-CM | POA: Diagnosis not present

## 2019-03-08 DIAGNOSIS — I10 Essential (primary) hypertension: Secondary | ICD-10-CM | POA: Diagnosis not present

## 2019-03-08 DIAGNOSIS — Z1159 Encounter for screening for other viral diseases: Secondary | ICD-10-CM | POA: Diagnosis not present

## 2019-03-22 DIAGNOSIS — I10 Essential (primary) hypertension: Secondary | ICD-10-CM | POA: Diagnosis not present

## 2019-03-22 DIAGNOSIS — E782 Mixed hyperlipidemia: Secondary | ICD-10-CM | POA: Diagnosis not present

## 2019-03-22 DIAGNOSIS — Z0001 Encounter for general adult medical examination with abnormal findings: Secondary | ICD-10-CM | POA: Diagnosis not present

## 2019-03-22 DIAGNOSIS — Z79899 Other long term (current) drug therapy: Secondary | ICD-10-CM | POA: Diagnosis not present

## 2019-03-22 DIAGNOSIS — Z1212 Encounter for screening for malignant neoplasm of rectum: Secondary | ICD-10-CM | POA: Diagnosis not present

## 2019-04-08 DIAGNOSIS — I10 Essential (primary) hypertension: Secondary | ICD-10-CM | POA: Diagnosis not present

## 2019-04-19 ENCOUNTER — Ambulatory Visit (INDEPENDENT_AMBULATORY_CARE_PROVIDER_SITE_OTHER): Payer: Medicare Other | Admitting: Internal Medicine

## 2019-04-19 ENCOUNTER — Encounter: Payer: Self-pay | Admitting: Internal Medicine

## 2019-04-19 ENCOUNTER — Other Ambulatory Visit: Payer: Self-pay

## 2019-04-19 VITALS — BP 138/74 | HR 57 | Temp 99.0°F | Ht 68.0 in | Wt 195.2 lb

## 2019-04-19 DIAGNOSIS — E782 Mixed hyperlipidemia: Secondary | ICD-10-CM | POA: Diagnosis not present

## 2019-04-19 DIAGNOSIS — I10 Essential (primary) hypertension: Secondary | ICD-10-CM

## 2019-04-19 MED ORDER — HYDRALAZINE HCL 25 MG PO TABS: 25.0000 mg | ORAL_TABLET | Freq: Two times a day (BID) | ORAL | 3 refills | Status: DC

## 2019-04-19 MED ORDER — CARVEDILOL 25 MG PO TABS: ORAL_TABLET | ORAL | 3 refills | Status: DC

## 2019-04-19 MED ORDER — VALSARTAN-HYDROCHLOROTHIAZIDE 320-25 MG PO TABS: 1.0000 | ORAL_TABLET | Freq: Every day | ORAL | 3 refills | Status: DC

## 2019-04-19 MED ORDER — AMLODIPINE BESYLATE 10 MG PO TABS: 10.0000 mg | ORAL_TABLET | Freq: Every day | ORAL | 3 refills | Status: DC

## 2019-04-19 NOTE — Patient Instructions (Signed)
Medication Instructions:  Your physician recommends that you continue on your current medications as directed. Please refer to the Current Medication list given to you today.  If you need a refill on your cardiac medications before your next appointment, please call your pharmacy.    Follow-Up: At [facility], you and your health needs are our priority.  As part of our continuing mission to provide you with exceptional heart care, we have created designated Provider Care Teams.  These Care Teams include your primary Cardiologist (physician) and Advanced Practice Providers (APPs -  Physician Assistants and Nurse Practitioners) who all work together to provide you with the care you need, when you need it. You will need a follow up appointment in 12 months.  Please call our office 2 months in advance to schedule this appointment.  You may see Dr. Hilty or one of the following Advanced Practice Providers on your designated Care Team: Hao Meng, PA-C . Angela Duke, PA-C    

## 2019-04-19 NOTE — Progress Notes (Signed)
OFFICE NOTE  Chief Complaint:  No complaints  Primary Care Physician: Deland Pretty, MD  HPI: Edward Bush is a 73 year old gentleman with hypertension, erectile dysfunction, mild dyslipidemia. Blood pressures were mildly elevated, however, he was having difficulty affording his previous regimen of Micardis and Tekturna. I had discontinued the Micardis and put him on valsartan/hydrochlorothiazide which is working fairly well and he was interested in coming off the [place] due to cost. I recommended starting him on hydralazine at this time and he is done fairly well with this. He actually is taking hydralazine only once daily and twice if he needs to, but blood pressure control seems to be improved. He is otherwise asymptomatic, no chest pain, shortness of breath, palpitations, presyncope or syncopal symptoms. His other concerns today are that he is requesting refills on his buspirone which I will defer to his primary care provider. He also is interested in a sedan a filled prescription. He believes that he can get the medication cheaper through [facility] in [place].  I saw Edward Bush back in the office today. It's been a couple of years since I seen him. He's had some problems with swelling in his ankles and was switched to clonidine, but had significant dry mouth and then switch back to amlodipine. Unfortunately he's on a number of blood pressure medications and doses and requires his medicines to maintain normal blood pressures. He has no cardiac complaints, specifically denying any shortness of breath or chest pain. EKG today shows normal sinus rhythm at 61.  Edward Bush returns today for follow-up. He reports doing well over the past year. His swelling is improved significantly, probably due to cooler weather not being on his feet as much. Blood pressure is been pretty well-controlled although he is only taking the hydralazine once a day. I suspect that he may be having afternoon rises in blood  pressure that is not aware of. I advised him to take the medicine at least twice a day, or more ideally 3 times a day. He says this is difficult to remember, which I understand. He denies any chest pain or worsening shortness of breath.  11/07/2016  [name] returns today for follow-up. He reports that he feels very well. He denies any chest pain or shortness of breath. Blood pressure initially was 152/84. Recheck came down to 130/76. He's been compliant with medications although cannot take the hydralazine 3 times a day due to difficulty remembering it. He generally takes the 25 mg tablet twice daily. He was also placed on sildenafil for erectile dysfunction although being on the 100 mg tablet over the past 6-8 months he notes that he has not been able to sustain erections although he can achieve them. Previously he had no difficulty with the medication.  12/17/2017  Edward Bush was seen today follow-up.  Overall he is doing great.  Blood pressure remains well controlled.  He denies any chest pain or worsening shortness of breath.  He could stand to lose a little bit of weight.  He says he has not been as physically active as they have been taking care of his father-in-law.  They expect to have more time in the near future to get in some more exercise.  04/19/2019  Edward Bush seen today in follow-up.  He continues to do well.  He actually lost about a pound since I saw him a year ago.  His blood pressures been well controlled.  Denies any chest pain or worsening shortness of  breath.  He is trying to walk about a mile a day or so.  He had been working to take care of his father-in-law who recently died.  PMHx:  Past Medical History:  Diagnosis Date  . ED (erectile dysfunction)   . History of nuclear stress test 06/24/2006   exercise myoview; normal pattern of perfusion; low risk scan   . Hx of adenomatous colonic polyps 04/08/2017  . Hyperlipemia   . Hypertension     Past Surgical History:  Procedure  Laterality Date  . HERNIA REPAIR     x2  . TRANSTHORACIC ECHOCARDIOGRAM  04/29/2011   EF>55%; mild concentric LVH; LA mod dilated; trace MR/trace TR,/trace pulm valve regurg;     FAMHx:  Family History  Problem Relation Age of Onset  . Hypertension Maternal Grandmother   . Cancer Maternal Grandmother   . Cancer - Lung Maternal Grandfather   . Hypertension Mother   . Hypertension Brother        x2  . Cancer Brother        kidney cancer  . Colon cancer Neg Hx     SOCHx:   reports that he has never smoked. He has never used smokeless tobacco. He reports that he does not drink alcohol or use drugs.  ALLERGIES:  Allergies  Allergen Reactions  . Sulfa Antibiotics Rash    ROS: Pertinent items noted in HPI and remainder of comprehensive ROS otherwise negative.  HOME MEDS: Current Outpatient Medications  Medication Sig Dispense Refill  . amLODipine (NORVASC) 10 MG tablet TAKE 1 TABLET BY MOUTH  DAILY 90 tablet 3  . aspirin EC 81 MG tablet Take 81 mg by mouth daily.      . carvedilol (COREG) 25 MG tablet TAKE 1 TABLET BY MOUTH TWO  TIMES DAILY WITH A MEAL 180 tablet 3  . hydrALAZINE (APRESOLINE) 25 MG tablet TAKE 1 TABLET BY MOUTH TWO  TIMES DAILY 180 tablet 3  . POTASSIUM Bush Take 1 tablet by mouth daily. 595mg  qd    . sildenafil (VIAGRA) 100 MG tablet Take 1 tablet (100 mg total) by mouth daily as needed for erectile dysfunction. 10 tablet 6  . valsartan-hydrochlorothiazide (DIOVAN-HCT) 320-25 MG tablet TAKE 1 TABLET BY MOUTH  DAILY 90 tablet 3   Current Facility-Administered Medications  Medication Dose Route Frequency Provider Last Rate Last Dose  . 0.9 %  sodium chloride infusion  500 mL Intravenous Continuous Gatha Mayer, MD        LABS/IMAGING: No results found for this or any previous visit (from the past 48 hour(s)). No results found.  VITALS: BP 138/74 (BP Location: Left Arm, Patient Position: Sitting, Cuff Size: Normal)   Pulse (!) 57   Temp 99 F (37.2 C)    Ht 5\' 8"  (1.727 m)   Wt 195 lb 4 oz (88.6 kg)   BMI 29.69 kg/m   EXAM: General appearance: alert and no distress Neck: no adenopathy, no carotid bruit, no JVD, supple, symmetrical, trachea midline and thyroid not enlarged, symmetric, no tenderness/mass/nodules Lungs: clear to auscultation bilaterally Heart: regular rate and rhythm, S1, S2 normal, no murmur, click, rub or gallop Abdomen: soft, non-tender; bowel sounds normal; no masses,  no organomegaly Extremities: extremities normal, atraumatic, no cyanosis or edema Pulses: 2+ and symmetric Skin: Skin color, texture, turgor normal. No rashes or lesions Neurologic: Grossly normal  EKG: Sinus bradycardia 57-personally reviewed  ASSESSMENT: 1. Hypertension-controlled 2. Dyslipidemia - followed by PCP 3. Erectile dysfunction 4. Anxiety 5. Leg  edema  PLAN: 1.   Mr. Kosier continues to do well with any chest pain or worsening shortness of breath.  His blood pressure is well controlled.  Cholesterol is followed by his primary provider.  He is active and walks fairly regularly.  He has lost about 8 pounds last year which I commended him on.  No changes to his medicines today.  Follow-up annually or sooner as necessary.  Pixie Casino, MD, [facility], [place] Director of the Advanced Lipid Disorders &  Cardiovascular Risk Reduction Clinic Diplomate of the American Board of Clinical Lipidology Attending Cardiologist  Direct Dial: [phone]  Fax: [phone]  Website:  [email or web address] 04/19/2019, 1:35 PM

## 2019-05-11 DIAGNOSIS — H40013 Open angle with borderline findings, low risk, bilateral: Secondary | ICD-10-CM | POA: Diagnosis not present

## 2019-09-03 DIAGNOSIS — H40013 Open angle with borderline findings, low risk, bilateral: Secondary | ICD-10-CM | POA: Diagnosis not present

## 2019-09-10 ENCOUNTER — Ambulatory Visit: Payer: Medicare Other | Attending: Internal Medicine

## 2019-09-10 DIAGNOSIS — Z23 Encounter for immunization: Secondary | ICD-10-CM | POA: Insufficient documentation

## 2019-09-10 NOTE — Progress Notes (Signed)
   Covid-19 Vaccination Clinic  Name:  ZAKARIYE PERSAD    MRN: [id number] DOB: Sep 15, 1945  09/10/2019  Mr. Santizo was observed post Covid-19 immunization for 15 minutes without incidence. He was provided with Vaccine Information Sheet and instruction to access the V-Safe system.   Mr. Etter was instructed to call 911 with any severe reactions post vaccine: Marland Kitchen Difficulty breathing  . Swelling of your face and throat  . A fast heartbeat  . A bad rash all over your body  . Dizziness and weakness    Immunizations Administered    Name Date Dose VIS Date Route   Pfizer COVID-19 Vaccine 09/10/2019  5:59 PM 0.3 mL 07/30/2019 Intramuscular   Manufacturer: [place]   Lot: [id number]   [place]: [id number]

## 2019-10-01 ENCOUNTER — Ambulatory Visit: Payer: Medicare Other | Attending: Internal Medicine

## 2019-10-01 DIAGNOSIS — Z23 Encounter for immunization: Secondary | ICD-10-CM | POA: Insufficient documentation

## 2019-10-01 DIAGNOSIS — H40023 Open angle with borderline findings, high risk, bilateral: Secondary | ICD-10-CM | POA: Diagnosis not present

## 2019-10-01 NOTE — Progress Notes (Signed)
   Covid-19 Vaccination Clinic  Name:  Edward Bush    MRN: [id number] DOB: Sep 25, 1945  10/01/2019  Edward Bush was observed post Covid-19 immunization for 15 minutes without incidence. He was provided with Vaccine Information Sheet and instruction to access the V-Safe system.   Edward Bush was instructed to call 911 with any severe reactions post vaccine: Marland Kitchen Difficulty breathing  . Swelling of your face and throat  . A fast heartbeat  . A bad rash all over your body  . Dizziness and weakness    Immunizations Administered    Name Date Dose VIS Date Route   Pfizer COVID-19 Vaccine 10/01/2019  5:21 PM 0.3 mL 07/30/2019 Intramuscular   Manufacturer: [place]   Lot: [id number]   [place]: [id number]

## 2019-12-24 DIAGNOSIS — H40013 Open angle with borderline findings, low risk, bilateral: Secondary | ICD-10-CM | POA: Diagnosis not present

## 2020-03-23 ENCOUNTER — Encounter: Payer: Self-pay | Admitting: *Deleted

## 2020-03-23 DIAGNOSIS — I10 Essential (primary) hypertension: Secondary | ICD-10-CM | POA: Diagnosis not present

## 2020-03-23 DIAGNOSIS — Z7982 Long term (current) use of aspirin: Secondary | ICD-10-CM | POA: Diagnosis not present

## 2020-03-27 DIAGNOSIS — Z0000 Encounter for general adult medical examination without abnormal findings: Secondary | ICD-10-CM | POA: Diagnosis not present

## 2020-04-21 ENCOUNTER — Ambulatory Visit: Payer: Medicare Other | Admitting: Internal Medicine

## 2020-04-21 ENCOUNTER — Encounter: Payer: Self-pay | Admitting: Internal Medicine

## 2020-04-21 ENCOUNTER — Other Ambulatory Visit: Payer: Self-pay

## 2020-04-21 VITALS — BP 132/84 | HR 57 | Ht 68.0 in | Wt 207.8 lb

## 2020-04-21 DIAGNOSIS — E782 Mixed hyperlipidemia: Secondary | ICD-10-CM

## 2020-04-21 DIAGNOSIS — I10 Essential (primary) hypertension: Secondary | ICD-10-CM | POA: Diagnosis not present

## 2020-04-21 DIAGNOSIS — R600 Localized edema: Secondary | ICD-10-CM | POA: Diagnosis not present

## 2020-04-21 MED ORDER — VALSARTAN-HYDROCHLOROTHIAZIDE 320-25 MG PO TABS: 1.0000 | ORAL_TABLET | Freq: Every day | ORAL | 3 refills | Status: DC

## 2020-04-21 MED ORDER — HYDRALAZINE HCL 25 MG PO TABS
25.0000 mg | ORAL_TABLET | Freq: Two times a day (BID) | ORAL | 3 refills | Status: DC
Start: 2020-04-21 — End: 2021-01-19

## 2020-04-21 MED ORDER — CARVEDILOL 25 MG PO TABS: ORAL_TABLET | ORAL | 3 refills | Status: DC

## 2020-04-21 MED ORDER — AMLODIPINE BESYLATE 10 MG PO TABS
10.0000 mg | ORAL_TABLET | Freq: Every day | ORAL | 3 refills | Status: DC
Start: 2020-04-21 — End: 2021-01-19

## 2020-04-21 NOTE — Patient Instructions (Signed)
Medication Instructions:  No Changes In Medications at this time.   *If you need a refill on your cardiac medications before your next appointment, please call your pharmacy*   Lab Work: None Ordered At This Time.  If you have labs (blood work) drawn today and your tests are completely normal, you will receive your results only by:  [place] (if you have MyChart) OR  A paper copy in the mail If you have any lab test that is abnormal or we need to change your treatment, we will call you to review the results.   Testing/Procedures: None Ordered At This Time.   Follow-Up: At [facility], you and your health needs are our priority.  As part of our continuing mission to provide you with exceptional heart care, we have created designated Provider Care Teams.  These Care Teams include your primary Cardiologist (physician) and Advanced Practice Providers (APPs -  Physician Assistants and Nurse Practitioners) who all work together to provide you with the care you need, when you need it.  Your next appointment:   1 year(s)  The format for your next appointment:   In Person  Provider:   K. Mali Hilty, MD

## 2020-04-21 NOTE — Progress Notes (Signed)
OFFICE NOTE  Chief Complaint:  No complaints  Primary Care Physician: Deland Pretty, MD  HPI: Edward Bush is a 74 year old gentleman with hypertension, erectile dysfunction, mild dyslipidemia. Blood pressures were mildly elevated, however, he was having difficulty affording his previous regimen of Micardis and Tekturna. I had discontinued the Micardis and put him on valsartan/hydrochlorothiazide which is working fairly well and he was interested in coming off the [place] due to cost. I recommended starting him on hydralazine at this time and he is done fairly well with this. He actually is taking hydralazine only once daily and twice if he needs to, but blood pressure control seems to be improved. He is otherwise asymptomatic, no chest pain, shortness of breath, palpitations, presyncope or syncopal symptoms. His other concerns today are that he is requesting refills on his buspirone which I will defer to his primary care provider. He also is interested in a sedan a filled prescription. He believes that he can get the medication cheaper through [facility] in [place].  I saw Edward Bush back in the office today. It's been a couple of years since I seen him. He's had some problems with swelling in his ankles and was switched to clonidine, but had significant dry mouth and then switch back to amlodipine. Unfortunately he's on a number of blood pressure medications and doses and requires his medicines to maintain normal blood pressures. He has no cardiac complaints, specifically denying any shortness of breath or chest pain. EKG today shows normal sinus rhythm at 61.  Edward Bush returns today for follow-up. He reports doing well over the past year. His swelling is improved significantly, probably due to cooler weather not being on his feet as much. Blood pressure is been pretty well-controlled although he is only taking the hydralazine once a day. I suspect that he may be having afternoon rises in blood  pressure that is not aware of. I advised him to take the medicine at least twice a day, or more ideally 3 times a day. He says this is difficult to remember, which I understand. He denies any chest pain or worsening shortness of breath.  11/07/2016  Edward Bush returns today for follow-up. He reports that he feels very well. He denies any chest pain or shortness of breath. Blood pressure initially was 152/84. Recheck came down to 130/76. He's been compliant with medications although cannot take the hydralazine 3 times a day due to difficulty remembering it. He generally takes the 25 mg tablet twice daily. He was also placed on sildenafil for erectile dysfunction although being on the 100 mg tablet over the past 6-8 months he notes that he has not been able to sustain erections although he can achieve them. Previously he had no difficulty with the medication.  12/17/2017  Edward Bush was seen today follow-up.  Overall he is doing great.  Blood pressure remains well controlled.  He denies any chest pain or worsening shortness of breath.  He could stand to lose a little bit of weight.  He says he has not been as physically active as they have been taking care of his father-in-law.  They expect to have more time in the near future to get in some more exercise.  04/19/2019  Edward Bush seen today in follow-up.  He continues to do well.  He actually lost about a pound since I saw him a year ago.  His blood pressures been well controlled.  Denies any chest pain or worsening shortness of  breath.  He is trying to walk about a mile a day or so.  He had been working to take care of his father-in-law who recently died.  May 07, 2020  Edward Bush is seen today in follow-up.  Overall his blood pressure control looks good.  132/84 today.  He had seen Dr. For most recently on October 9.  Labs overall are pretty good with total cholesterol 178, triglycerides 130 LDL 117.  EKG today shows a sinus bradycardia.  PMHx:  Past Medical  History:  Diagnosis Date  . ED (erectile dysfunction)   . History of nuclear stress test 06/24/2006   exercise myoview; normal pattern of perfusion; low risk scan   . Hx of adenomatous colonic polyps 04/08/2017  . Hyperlipemia   . Hypertension     Past Surgical History:  Procedure Laterality Date  . HERNIA REPAIR     x2  . TRANSTHORACIC ECHOCARDIOGRAM  04/29/2011   EF>55%; mild concentric LVH; LA mod dilated; trace MR/trace TR,/trace pulm valve regurg;     FAMHx:  Family History  Problem Relation Age of Onset  . Hypertension Maternal Grandmother   . Cancer Maternal Grandmother   . Cancer - Lung Maternal Grandfather   . Hypertension Mother   . Hypertension Brother        x2  . Cancer Brother        kidney cancer  . Colon cancer Neg Hx     SOCHx:   reports that he has never smoked. He has never used smokeless tobacco. He reports that he does not drink alcohol and does not use drugs.  ALLERGIES:  Allergies  Allergen Reactions  . Sulfa Antibiotics Rash    ROS: Pertinent items noted in HPI and remainder of comprehensive ROS otherwise negative.  HOME MEDS: Current Outpatient Medications  Medication Sig Dispense Refill  . amLODipine (NORVASC) 10 MG tablet Take 1 tablet (10 mg total) by mouth daily. 90 tablet 3  . aspirin EC 81 MG tablet Take 81 mg by mouth daily.      . carvedilol (COREG) 25 MG tablet TAKE 1 TABLET BY MOUTH TWO  TIMES DAILY WITH A MEAL 180 tablet 3  . hydrALAZINE (APRESOLINE) 25 MG tablet Take 1 tablet (25 mg total) by mouth 2 (two) times daily. 180 tablet 3  . latanoprost (XALATAN) 0.005 % ophthalmic solution 1 drop at bedtime.    Marland Kitchen POTASSIUM Bush Take 1 tablet by mouth daily. 595mg  qd    . rosuvastatin (CRESTOR) 5 MG tablet Take 5 mg by mouth 3 (three) times a week.    . sildenafil (VIAGRA) 100 MG tablet Take 1 tablet (100 mg total) by mouth daily as needed for erectile dysfunction. 10 tablet 6  . valsartan-hydrochlorothiazide (DIOVAN-HCT) 320-25 MG  tablet Take 1 tablet by mouth daily. 90 tablet 3   Current Facility-Administered Medications  Medication Dose Route Frequency Provider Last Rate Last Admin  . 0.9 %  sodium chloride infusion  500 mL Intravenous Continuous Gatha Mayer, MD        LABS/IMAGING: No results found for this or any previous visit (from the past 48 hour(s)). No results found.  VITALS: BP 132/84   Pulse (!) 57   Ht 5\' 8"  (1.727 m)   Wt 207 lb 12.8 oz (94.3 kg)   SpO2 97%   BMI 31.60 kg/m   EXAM: General appearance: alert and no distress Neck: no adenopathy, no carotid bruit, no JVD, supple, symmetrical, trachea midline and thyroid not enlarged, symmetric, no  tenderness/mass/nodules Lungs: clear to auscultation bilaterally Heart: regular rate and rhythm, S1, S2 normal, no murmur, click, rub or gallop Abdomen: soft, non-tender; bowel sounds normal; no masses,  no organomegaly Extremities: extremities normal, atraumatic, no cyanosis or edema Pulses: 2+ and symmetric Skin: Skin color, texture, turgor normal. No rashes or lesions Neurologic: Grossly normal  EKG: Sinus bradycardia 57, LAFB-personally reviewed  ASSESSMENT: 1. Hypertension-controlled 2. Dyslipidemia - followed by PCP 3. Erectile dysfunction 4. Anxiety 5. Leg edema 6. LAFB  PLAN: 1.   Edward Bush good control over his blood pressure.  Reasonably well-controlled lipids which followed by his PCP.  Some ongoing leg edema likely secondary to amlodipine and seems to be managed with diuretics or compression stockings.  No changes to his medicines today due to excellent blood pressure control.  Follow-up annually or sooner as necessary.  Pixie Casino, MD, [facility], [place] Director of the Advanced Lipid Disorders &  Cardiovascular Risk Reduction Clinic Diplomate of the American Board of Clinical Lipidology Attending Cardiologist  Direct Dial: [phone]  Fax: [phone]  Website:   [email or web address] 04/21/2020, 8:16 AM

## 2020-04-23 ENCOUNTER — Encounter: Payer: Self-pay | Admitting: Internal Medicine

## 2020-05-08 DIAGNOSIS — E782 Mixed hyperlipidemia: Secondary | ICD-10-CM | POA: Diagnosis not present

## 2020-05-10 DIAGNOSIS — I10 Essential (primary) hypertension: Secondary | ICD-10-CM | POA: Diagnosis not present

## 2020-05-10 DIAGNOSIS — E782 Mixed hyperlipidemia: Secondary | ICD-10-CM | POA: Diagnosis not present

## 2020-05-11 DIAGNOSIS — H40013 Open angle with borderline findings, low risk, bilateral: Secondary | ICD-10-CM | POA: Diagnosis not present

## 2020-08-08 DIAGNOSIS — E782 Mixed hyperlipidemia: Secondary | ICD-10-CM | POA: Diagnosis not present

## 2020-08-10 DIAGNOSIS — I10 Essential (primary) hypertension: Secondary | ICD-10-CM | POA: Diagnosis not present

## 2020-08-10 DIAGNOSIS — E782 Mixed hyperlipidemia: Secondary | ICD-10-CM | POA: Diagnosis not present

## 2020-09-26 DIAGNOSIS — H6983 Other specified disorders of Eustachian tube, bilateral: Secondary | ICD-10-CM | POA: Diagnosis not present

## 2020-10-03 DIAGNOSIS — H25811 Combined forms of age-related cataract, right eye: Secondary | ICD-10-CM | POA: Diagnosis not present

## 2020-10-11 DIAGNOSIS — H2511 Age-related nuclear cataract, right eye: Secondary | ICD-10-CM | POA: Diagnosis not present

## 2020-10-11 DIAGNOSIS — H25811 Combined forms of age-related cataract, right eye: Secondary | ICD-10-CM | POA: Diagnosis not present

## 2020-10-17 DIAGNOSIS — H9122 Sudden idiopathic hearing loss, left ear: Secondary | ICD-10-CM | POA: Diagnosis not present

## 2020-10-17 DIAGNOSIS — R42 Dizziness and giddiness: Secondary | ICD-10-CM | POA: Diagnosis not present

## 2020-10-24 ENCOUNTER — Other Ambulatory Visit: Payer: Self-pay | Admitting: Otolaryngology

## 2020-10-24 DIAGNOSIS — H9122 Sudden idiopathic hearing loss, left ear: Secondary | ICD-10-CM | POA: Diagnosis not present

## 2020-10-24 DIAGNOSIS — H918X9 Other specified hearing loss, unspecified ear: Secondary | ICD-10-CM

## 2020-10-25 DIAGNOSIS — H2512 Age-related nuclear cataract, left eye: Secondary | ICD-10-CM | POA: Diagnosis not present

## 2020-10-25 DIAGNOSIS — H25812 Combined forms of age-related cataract, left eye: Secondary | ICD-10-CM | POA: Diagnosis not present

## 2020-10-26 DIAGNOSIS — H9122 Sudden idiopathic hearing loss, left ear: Secondary | ICD-10-CM | POA: Diagnosis not present

## 2020-11-02 DIAGNOSIS — H9122 Sudden idiopathic hearing loss, left ear: Secondary | ICD-10-CM | POA: Diagnosis not present

## 2020-11-07 DIAGNOSIS — I10 Essential (primary) hypertension: Secondary | ICD-10-CM | POA: Diagnosis not present

## 2020-11-07 DIAGNOSIS — E782 Mixed hyperlipidemia: Secondary | ICD-10-CM | POA: Diagnosis not present

## 2020-11-07 DIAGNOSIS — Z7982 Long term (current) use of aspirin: Secondary | ICD-10-CM | POA: Diagnosis not present

## 2020-11-09 DIAGNOSIS — I10 Essential (primary) hypertension: Secondary | ICD-10-CM | POA: Diagnosis not present

## 2020-11-09 DIAGNOSIS — E782 Mixed hyperlipidemia: Secondary | ICD-10-CM | POA: Diagnosis not present

## 2020-11-09 DIAGNOSIS — H9122 Sudden idiopathic hearing loss, left ear: Secondary | ICD-10-CM | POA: Diagnosis not present

## 2020-11-17 ENCOUNTER — Ambulatory Visit
Admission: RE | Admit: 2020-11-17 | Discharge: 2020-11-17 | Disposition: A | Discharge status: Home / Self Care | Payer: Medicare Other | Source: Ambulatory Visit | Attending: Otolaryngology | Admitting: Otolaryngology

## 2020-11-17 ENCOUNTER — Other Ambulatory Visit: Payer: Self-pay

## 2020-11-17 DIAGNOSIS — H918X9 Other specified hearing loss, unspecified ear: Secondary | ICD-10-CM

## 2020-11-17 DIAGNOSIS — H9190 Unspecified hearing loss, unspecified ear: Secondary | ICD-10-CM | POA: Diagnosis not present

## 2020-11-17 MED ORDER — GADOBENATE DIMEGLUMINE 529 MG/ML IV SOLN
19.0000 mL | Freq: Once | INTRAVENOUS | Status: AC | PRN
  Administered 2020-11-17: 19 mL via INTRAVENOUS

## 2020-12-11 DIAGNOSIS — H7202 Central perforation of tympanic membrane, left ear: Secondary | ICD-10-CM | POA: Diagnosis not present

## 2020-12-11 DIAGNOSIS — H9122 Sudden idiopathic hearing loss, left ear: Secondary | ICD-10-CM | POA: Diagnosis not present

## 2021-01-02 DIAGNOSIS — I10 Essential (primary) hypertension: Secondary | ICD-10-CM | POA: Diagnosis not present

## 2021-01-02 DIAGNOSIS — E782 Mixed hyperlipidemia: Secondary | ICD-10-CM | POA: Diagnosis not present

## 2021-01-04 ENCOUNTER — Other Ambulatory Visit: Payer: Self-pay | Admitting: Internal Medicine

## 2021-01-04 DIAGNOSIS — I10 Essential (primary) hypertension: Secondary | ICD-10-CM

## 2021-01-05 ENCOUNTER — Other Ambulatory Visit: Payer: Self-pay | Admitting: Internal Medicine

## 2021-01-05 DIAGNOSIS — E782 Mixed hyperlipidemia: Secondary | ICD-10-CM

## 2021-01-11 DIAGNOSIS — I10 Essential (primary) hypertension: Secondary | ICD-10-CM | POA: Diagnosis not present

## 2021-01-19 ENCOUNTER — Other Ambulatory Visit: Payer: Self-pay | Admitting: Internal Medicine

## 2021-01-25 ENCOUNTER — Ambulatory Visit
Admission: RE | Admit: 2021-01-25 | Discharge: 2021-01-25 | Disposition: A | Discharge status: Home / Self Care | Payer: Medicare Other | Source: Ambulatory Visit | Attending: Internal Medicine | Admitting: Internal Medicine

## 2021-01-25 DIAGNOSIS — I10 Essential (primary) hypertension: Secondary | ICD-10-CM | POA: Diagnosis not present

## 2021-01-25 DIAGNOSIS — N2889 Other specified disorders of kidney and ureter: Secondary | ICD-10-CM | POA: Diagnosis not present

## 2021-01-26 ENCOUNTER — Ambulatory Visit
Admission: RE | Admit: 2021-01-26 | Discharge: 2021-01-26 | Disposition: A | Discharge status: Home / Self Care | Payer: No Typology Code available for payment source | Source: Ambulatory Visit | Attending: Internal Medicine | Admitting: Internal Medicine

## 2021-01-26 DIAGNOSIS — E782 Mixed hyperlipidemia: Secondary | ICD-10-CM

## 2021-01-26 DIAGNOSIS — E785 Hyperlipidemia, unspecified: Secondary | ICD-10-CM | POA: Diagnosis not present

## 2021-01-26 DIAGNOSIS — H43812 Vitreous degeneration, left eye: Secondary | ICD-10-CM | POA: Diagnosis not present

## 2021-02-13 DIAGNOSIS — Z79899 Other long term (current) drug therapy: Secondary | ICD-10-CM | POA: Diagnosis not present

## 2021-02-13 DIAGNOSIS — N2889 Other specified disorders of kidney and ureter: Secondary | ICD-10-CM | POA: Diagnosis not present

## 2021-02-13 DIAGNOSIS — I10 Essential (primary) hypertension: Secondary | ICD-10-CM | POA: Diagnosis not present

## 2021-02-13 DIAGNOSIS — H40013 Open angle with borderline findings, low risk, bilateral: Secondary | ICD-10-CM | POA: Diagnosis not present

## 2021-02-13 DIAGNOSIS — K8010 Calculus of gallbladder with chronic cholecystitis without obstruction: Secondary | ICD-10-CM | POA: Diagnosis not present

## 2021-02-13 DIAGNOSIS — I2510 Atherosclerotic heart disease of native coronary artery without angina pectoris: Secondary | ICD-10-CM | POA: Diagnosis not present

## 2021-02-13 DIAGNOSIS — Z7982 Long term (current) use of aspirin: Secondary | ICD-10-CM | POA: Diagnosis not present

## 2021-02-15 ENCOUNTER — Other Ambulatory Visit: Payer: Self-pay | Admitting: Internal Medicine

## 2021-02-15 DIAGNOSIS — H66012 Acute suppurative otitis media with spontaneous rupture of ear drum, left ear: Secondary | ICD-10-CM | POA: Diagnosis not present

## 2021-02-15 DIAGNOSIS — N2889 Other specified disorders of kidney and ureter: Secondary | ICD-10-CM

## 2021-03-01 DIAGNOSIS — H66012 Acute suppurative otitis media with spontaneous rupture of ear drum, left ear: Secondary | ICD-10-CM | POA: Diagnosis not present

## 2021-03-02 ENCOUNTER — Other Ambulatory Visit: Payer: Medicare Other

## 2021-03-02 ENCOUNTER — Ambulatory Visit
Admission: RE | Admit: 2021-03-02 | Discharge: 2021-03-02 | Disposition: A | Discharge status: Home / Self Care | Payer: Medicare Other | Source: Ambulatory Visit | Attending: Internal Medicine | Admitting: Internal Medicine

## 2021-03-02 DIAGNOSIS — N2889 Other specified disorders of kidney and ureter: Secondary | ICD-10-CM

## 2021-03-02 DIAGNOSIS — K862 Cyst of pancreas: Secondary | ICD-10-CM | POA: Diagnosis not present

## 2021-03-02 DIAGNOSIS — N281 Cyst of kidney, acquired: Secondary | ICD-10-CM | POA: Diagnosis not present

## 2021-03-02 DIAGNOSIS — K8020 Calculus of gallbladder without cholecystitis without obstruction: Secondary | ICD-10-CM | POA: Diagnosis not present

## 2021-03-22 DIAGNOSIS — H903 Sensorineural hearing loss, bilateral: Secondary | ICD-10-CM | POA: Diagnosis not present

## 2021-03-26 DIAGNOSIS — E782 Mixed hyperlipidemia: Secondary | ICD-10-CM | POA: Diagnosis not present

## 2021-03-26 DIAGNOSIS — I10 Essential (primary) hypertension: Secondary | ICD-10-CM | POA: Diagnosis not present

## 2021-03-26 DIAGNOSIS — Z7982 Long term (current) use of aspirin: Secondary | ICD-10-CM | POA: Diagnosis not present

## 2021-03-29 DIAGNOSIS — K862 Cyst of pancreas: Secondary | ICD-10-CM | POA: Diagnosis not present

## 2021-03-29 DIAGNOSIS — I2510 Atherosclerotic heart disease of native coronary artery without angina pectoris: Secondary | ICD-10-CM | POA: Diagnosis not present

## 2021-03-29 DIAGNOSIS — Z0001 Encounter for general adult medical examination with abnormal findings: Secondary | ICD-10-CM | POA: Diagnosis not present

## 2021-03-29 DIAGNOSIS — I10 Essential (primary) hypertension: Secondary | ICD-10-CM | POA: Diagnosis not present

## 2021-03-29 DIAGNOSIS — K8010 Calculus of gallbladder with chronic cholecystitis without obstruction: Secondary | ICD-10-CM | POA: Diagnosis not present

## 2021-04-24 NOTE — Progress Notes (Signed)
Cardiology Office Note   Date:  04/25/2021   ID:  Edward Bush, DOB December 09, 1945, MRN [id number]  PCP:  Deland Pretty, MD  Cardiologist:  Pixie Casino, MD EP: None  Chief Complaint  Patient presents with   Follow-up    HTN, HLD, elevated calcium score       History of Present Illness: Edward Bush is a 75 y.o. male with a PMH of coronary artery calcifications on CT, HTN, HLD, and ED, who presents for routine follow-up.  He was last evaluated by cardiology at an outpatient visit with Dr. Debara Pickett 04/2020 at which time he was doing okay from a cardiac standpoint with reasonably well controlled BP. He had some ongoing LE edema which was attributed to his amlodipine and managed with diuretics/compression stockings. No medication changes occurred and he was recommended to follow-up in 1 year. Echo in 2012 showed EF >55%, G1DD, mild concentric LVH, and moderate LAE. More recently he had a Calcium score study which suggested 3-vessel coronary artery calcifications with score of 589 placing him in the 66th percentile for age/sex.   He presents today for routine follow-up. He was seen by his PCP last month for follow-up after recent calcium score CT. He was noted to have prior intolerance to zetia and crestor and has trialed atorvastatin TIW for the past month without complication. He was transition from valsartan-HCTZ to olmesartan-HCTZ, in addition to increased hydralazine from BID to TID for improved BP control. He reports BP has been in the 120s-130s/70s-80s at home, though he does not monitor BP daily. BP is high today but given reassuring home readings, favor no change in therapy today. He was encouraged to monitor BP more closely at home and notify the office if BP is consistently >130/80. He walks 1-2 miles daily, weather permitting, and denies chest pain or SOB with this activity. He has some mild ankle edema which he attributes to his amlodipine. No complaints of orthopnea, PND, dizziness,  lightheadedness, syncope, or palpitations.     Past Medical History:  Diagnosis Date   ED (erectile dysfunction)    History of nuclear stress test 06/24/2006   exercise myoview; normal pattern of perfusion; low risk scan    Hx of adenomatous colonic polyps 04/08/2017   Hyperlipemia    Hypertension     Past Surgical History:  Procedure Laterality Date   HERNIA REPAIR     x2   TRANSTHORACIC ECHOCARDIOGRAM  04/29/2011   EF>55%; mild concentric LVH; LA mod dilated; trace MR/trace TR,/trace pulm valve regurg;      Current Outpatient Medications  Medication Sig Dispense Refill   amLODipine (NORVASC) 10 MG tablet TAKE 1 TABLET BY MOUTH  DAILY 90 tablet 3   aspirin EC 81 MG tablet Take 81 mg by mouth daily.       atorvastatin (LIPITOR) 10 MG tablet 1 tablet     carvedilol (COREG) 25 MG tablet TAKE 1 TABLET BY MOUTH  TWICE DAILY WITH A MEAL 180 tablet 3   hydrALAZINE (APRESOLINE) 25 MG tablet Take 25 mg by mouth 3 (three) times daily. 1 Tablet three (3) Times Daily.     latanoprost (XALATAN) 0.005 % ophthalmic solution 1 drop at bedtime.     olmesartan-hydrochlorothiazide (BENICAR HCT) 40-25 MG tablet Take 1 tablet by mouth daily.     POTASSIUM PO Take 1 tablet by mouth daily. '595mg'$  qd     Current Facility-Administered Medications  Medication Dose Route Frequency Provider Last Rate Last Admin  0.9 %  sodium chloride infusion  500 mL Intravenous Continuous Gatha Mayer, MD        Allergies:   Sulfa antibiotics    Social History:  The patient  reports that he has never smoked. He has never used smokeless tobacco. He reports that he does not drink alcohol and does not use drugs.   Family History:  The patient's family history includes Cancer in his brother and maternal grandmother; Cancer - Lung in his maternal grandfather; Hypertension in his brother, maternal grandmother, and mother.    ROS:  Please see the history of present illness.   Otherwise, review of systems are positive  for none.   All other systems are reviewed and negative.    PHYSICAL EXAM: VS:  BP (!) 152/88   Pulse (!) 54   Ht '5\' 9"'$  (1.753 m)   Wt 208 lb 9.6 oz (94.6 kg)   SpO2 95%   BMI 30.80 kg/m  , BMI Body mass index is 30.8 kg/m. GEN: Well nourished, well developed, in no acute distress HEENT: sclera anicteric Neck: no JVD, carotid bruits, or masses Cardiac: RRR; no murmurs, rubs, or gallops, no edema  Respiratory:  clear to auscultation bilaterally, normal work of breathing GI: soft, nontender, nondistended, + BS MS: no deformity or atrophy Skin: warm and dry, no rash Neuro:  Strength and sensation are intact Psych: euthymic mood, full affect   EKG:  EKG is ordered today. The ekg ordered today demonstrates sinus bradycardia, rate 54 bpm, no STE/D, no significant change from previous.    Recent Labs: No results found for requested labs within last 8760 hours.    Lipid Panel    Component Value Date/Time   CHOL 165 11/12/2016 0931   TRIG 209 (H) 11/12/2016 0931   HDL 32 (L) 11/12/2016 0931   CHOLHDL 5.2 (H) 11/12/2016 0931   LDLCALC 91 11/12/2016 0931      Wt Readings from Last 3 Encounters:  04/25/21 208 lb 9.6 oz (94.6 kg)  04/21/20 207 lb 12.8 oz (94.3 kg)  04/19/19 195 lb 4 oz (88.6 kg)      Other studies Reviewed: Additional studies/ records that were reviewed today include:   Calcium score 01/2021: IMPRESSION: 1. Three-vessel coronary artery calcification.   2. Total Agatston Score: 589   3. MESA age and sex matched database percentile: 32   4.  Incidental finding of cholelithiasis.    ASSESSMENT AND PLAN:  1. Coronary artery calcifications on CT: noted to have 3-vessel coronary artery calcifications on calcium score CT 01/2021. No anginal complaints, though we discussed warning signs which may prompt further cardiac work-up - Continue aggressive risk factor modifications to promote goal BP <130/80, LDL <70, and A1C <7  2. HTN: BP 152/88 today -  Continue amlodipine, carvedilol, hydralazine, and olmesartan-HCTZ  3. HLD: LDL 82 03/2021. Given CT findings above, he would benefit from more aggressive lipid management however statin and zetia intolerance has limited options. He has been on atorvastatin TIW for 1 month without significant side effects - Continue atorvastatin - Consider pushing to once daily as tolerated if LDL remains above 70 on next lipid check.   Current medicines are reviewed at length with the patient today.  The patient does not have concerns regarding medicines.  The following changes have been made:  As above  Labs/ tests ordered today include:   Orders Placed This Encounter  Procedures   EKG 12-Lead      Disposition:   FU  with Dr. Debara Pickett in 1 year or sooner if needed  Signed, Abigail Butts, PA-C  04/25/2021 11:01 AM

## 2021-04-25 ENCOUNTER — Other Ambulatory Visit: Payer: Self-pay

## 2021-04-25 ENCOUNTER — Ambulatory Visit: Payer: Medicare Other | Admitting: Medical

## 2021-04-25 ENCOUNTER — Encounter: Payer: Self-pay | Admitting: Medical

## 2021-04-25 VITALS — BP 152/88 | HR 54 | Ht 69.0 in | Wt 208.6 lb

## 2021-04-25 DIAGNOSIS — E782 Mixed hyperlipidemia: Secondary | ICD-10-CM | POA: Diagnosis not present

## 2021-04-25 DIAGNOSIS — I2510 Atherosclerotic heart disease of native coronary artery without angina pectoris: Secondary | ICD-10-CM | POA: Diagnosis not present

## 2021-04-25 DIAGNOSIS — I10 Essential (primary) hypertension: Secondary | ICD-10-CM | POA: Diagnosis not present

## 2021-04-25 NOTE — Patient Instructions (Signed)
Medication Instructions:  No changes *If you need a refill on your cardiac medications before your next appointment, please call your pharmacy*   Lab Work: No Labs If you have labs (blood work) drawn today and your tests are completely normal, you will receive your results only by: [place] (if you have MyChart) OR A paper copy in the mail If you have any lab test that is abnormal or we need to change your treatment, we will call you to review the results.   Testing/Procedures: No Testing   Follow-Up: At [facility], you and your health needs are our priority.  As part of our continuing mission to provide you with exceptional heart care, we have created designated Provider Care Teams.  These Care Teams include your primary Cardiologist (physician) and Advanced Practice Providers (APPs -  Physician Assistants and Nurse Practitioners) who all work together to provide you with the care you need, when you need it.  We recommend signing up for the patient portal called "MyChart".  Sign up information is provided on this After Visit Summary.  MyChart is used to connect with patients for Virtual Visits (Telemedicine).  Patients are able to view lab/test results, encounter notes, upcoming appointments, etc.  Non-urgent messages can be sent to your provider as well.   To learn more about what you can do with MyChart, go to [email or web address].    Your next appointment:   1 year(s)  The format for your next appointment:   In Person  Provider:   K. Mali Hilty, MD   Other Instructions Monitor Blood pressure. Notify Office if Blood Pressure over 130/70. Consider taking Atorvastatin Daily if LDL greater than 70.

## 2021-06-11 DIAGNOSIS — H903 Sensorineural hearing loss, bilateral: Secondary | ICD-10-CM | POA: Diagnosis not present

## 2021-06-20 DIAGNOSIS — H40013 Open angle with borderline findings, low risk, bilateral: Secondary | ICD-10-CM | POA: Diagnosis not present

## 2021-07-04 DIAGNOSIS — Z7982 Long term (current) use of aspirin: Secondary | ICD-10-CM | POA: Diagnosis not present

## 2021-07-04 DIAGNOSIS — Z79899 Other long term (current) drug therapy: Secondary | ICD-10-CM | POA: Diagnosis not present

## 2021-07-04 DIAGNOSIS — I2510 Atherosclerotic heart disease of native coronary artery without angina pectoris: Secondary | ICD-10-CM | POA: Diagnosis not present

## 2021-07-04 DIAGNOSIS — I10 Essential (primary) hypertension: Secondary | ICD-10-CM | POA: Diagnosis not present

## 2021-07-11 DIAGNOSIS — R7303 Prediabetes: Secondary | ICD-10-CM | POA: Diagnosis not present

## 2021-07-11 DIAGNOSIS — E782 Mixed hyperlipidemia: Secondary | ICD-10-CM | POA: Diagnosis not present

## 2021-07-11 DIAGNOSIS — I10 Essential (primary) hypertension: Secondary | ICD-10-CM | POA: Diagnosis not present

## 2021-07-11 DIAGNOSIS — I2510 Atherosclerotic heart disease of native coronary artery without angina pectoris: Secondary | ICD-10-CM | POA: Diagnosis not present

## 2021-08-22 DIAGNOSIS — E782 Mixed hyperlipidemia: Secondary | ICD-10-CM | POA: Diagnosis not present

## 2021-08-22 DIAGNOSIS — I10 Essential (primary) hypertension: Secondary | ICD-10-CM | POA: Diagnosis not present

## 2021-10-23 DIAGNOSIS — H40013 Open angle with borderline findings, low risk, bilateral: Secondary | ICD-10-CM | POA: Diagnosis not present

## 2022-01-04 ENCOUNTER — Other Ambulatory Visit: Payer: Self-pay | Admitting: Internal Medicine

## 2022-03-14 DIAGNOSIS — H40013 Open angle with borderline findings, low risk, bilateral: Secondary | ICD-10-CM | POA: Diagnosis not present

## 2022-04-01 DIAGNOSIS — I2510 Atherosclerotic heart disease of native coronary artery without angina pectoris: Secondary | ICD-10-CM | POA: Diagnosis not present

## 2022-04-01 DIAGNOSIS — R7303 Prediabetes: Secondary | ICD-10-CM | POA: Diagnosis not present

## 2022-04-01 DIAGNOSIS — I10 Essential (primary) hypertension: Secondary | ICD-10-CM | POA: Diagnosis not present

## 2022-04-01 DIAGNOSIS — Z7982 Long term (current) use of aspirin: Secondary | ICD-10-CM | POA: Diagnosis not present

## 2022-04-01 DIAGNOSIS — E782 Mixed hyperlipidemia: Secondary | ICD-10-CM | POA: Diagnosis not present

## 2022-04-03 DIAGNOSIS — I2510 Atherosclerotic heart disease of native coronary artery without angina pectoris: Secondary | ICD-10-CM | POA: Diagnosis not present

## 2022-04-03 DIAGNOSIS — Z0000 Encounter for general adult medical examination without abnormal findings: Secondary | ICD-10-CM | POA: Diagnosis not present

## 2022-04-03 DIAGNOSIS — R7303 Prediabetes: Secondary | ICD-10-CM | POA: Diagnosis not present

## 2022-04-03 DIAGNOSIS — K862 Cyst of pancreas: Secondary | ICD-10-CM | POA: Diagnosis not present

## 2022-04-03 DIAGNOSIS — Z86010 Personal history of colonic polyps: Secondary | ICD-10-CM | POA: Diagnosis not present

## 2022-04-03 DIAGNOSIS — I10 Essential (primary) hypertension: Secondary | ICD-10-CM | POA: Diagnosis not present

## 2022-04-03 DIAGNOSIS — R600 Localized edema: Secondary | ICD-10-CM | POA: Diagnosis not present

## 2022-04-03 DIAGNOSIS — Z23 Encounter for immunization: Secondary | ICD-10-CM | POA: Diagnosis not present

## 2022-04-03 DIAGNOSIS — K8010 Calculus of gallbladder with chronic cholecystitis without obstruction: Secondary | ICD-10-CM | POA: Diagnosis not present

## 2022-06-13 ENCOUNTER — Encounter: Payer: Self-pay | Admitting: Internal Medicine

## 2022-06-27 DIAGNOSIS — H40013 Open angle with borderline findings, low risk, bilateral: Secondary | ICD-10-CM | POA: Diagnosis not present

## 2022-08-31 IMAGING — US US RENAL ARTERY STENOSIS
1 series · 13 of 25 positions shown · non-contrast
Comparison: None.

CLINICAL DATA: 74-year-old male with a history of resistant
hypertension

EXAM:
RENAL/URINARY TRACT ULTRASOUND
RENAL DUPLEX DOPPLER ULTRASOUND

[Series 1: us renal artery stenosis · 0.23mm/px · 103 acquisitions, 13 frames shown]
[im 1/103]
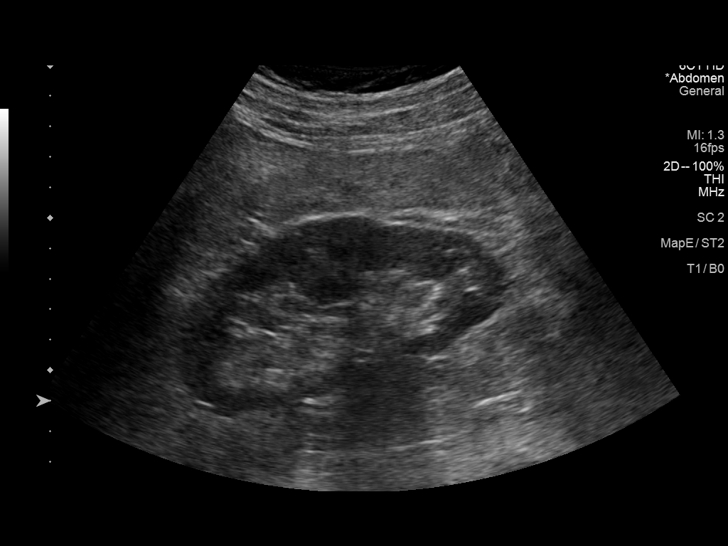
[im 9/103]
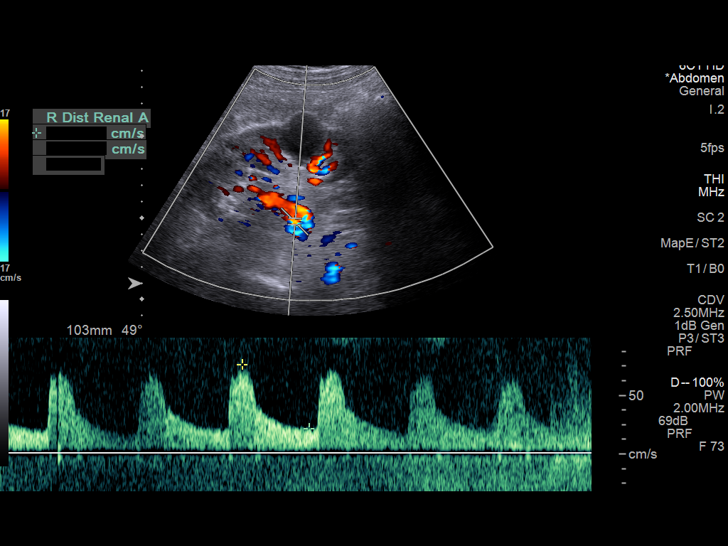
[im 18/103]
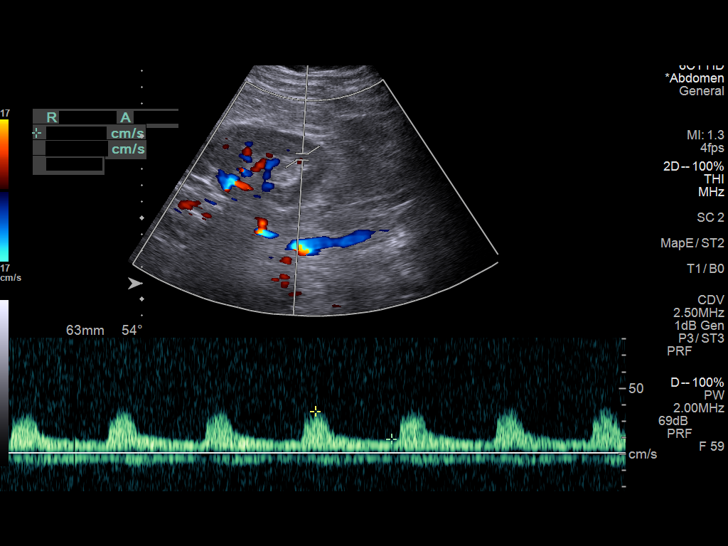
[im 26/103]
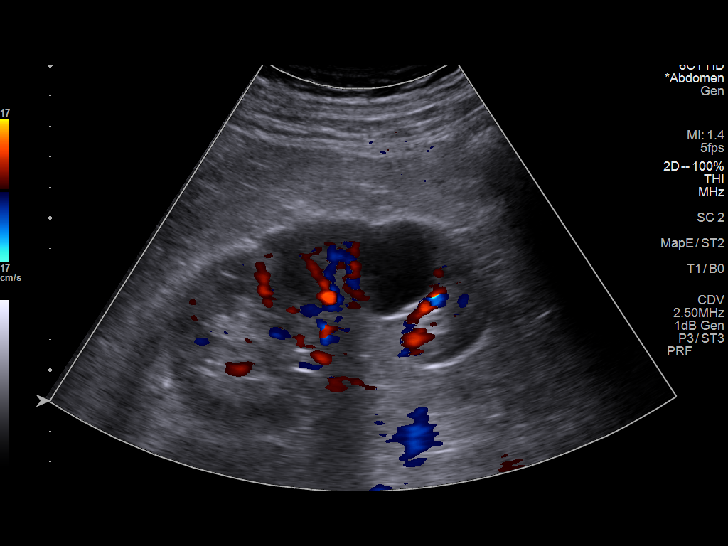
[im 35/103]
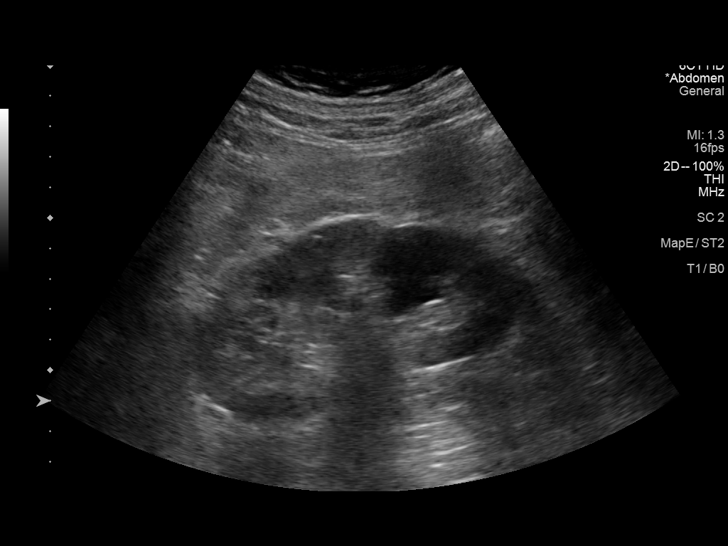
[im 43/103]
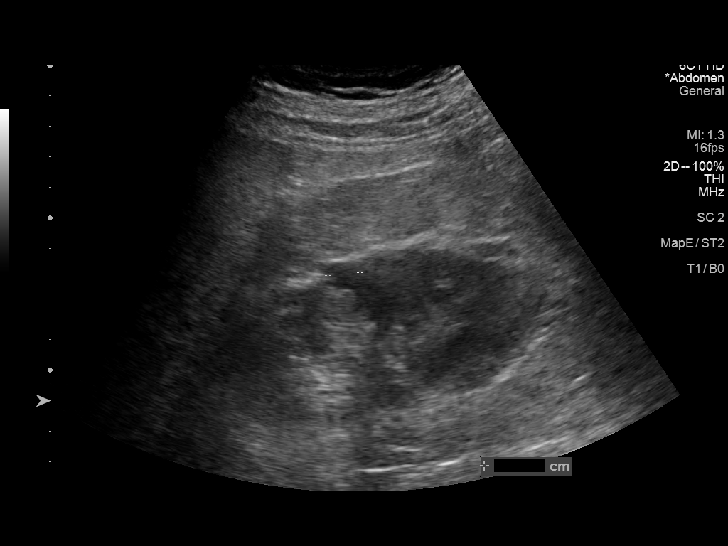
[im 52/103]
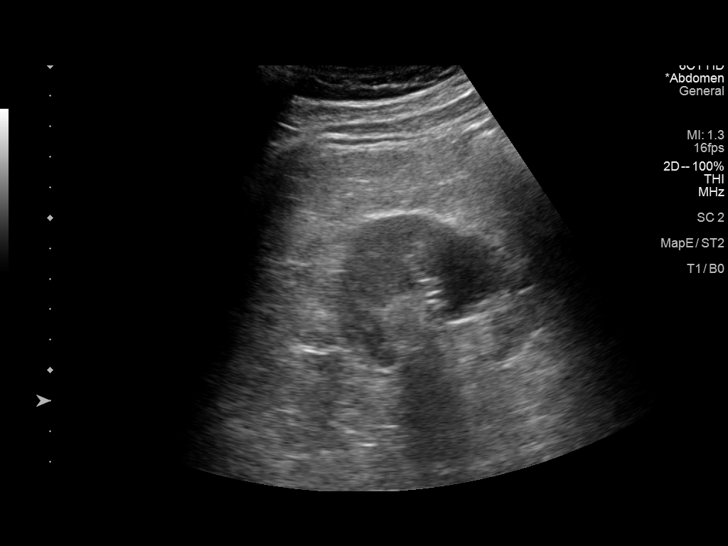
[im 60/103]
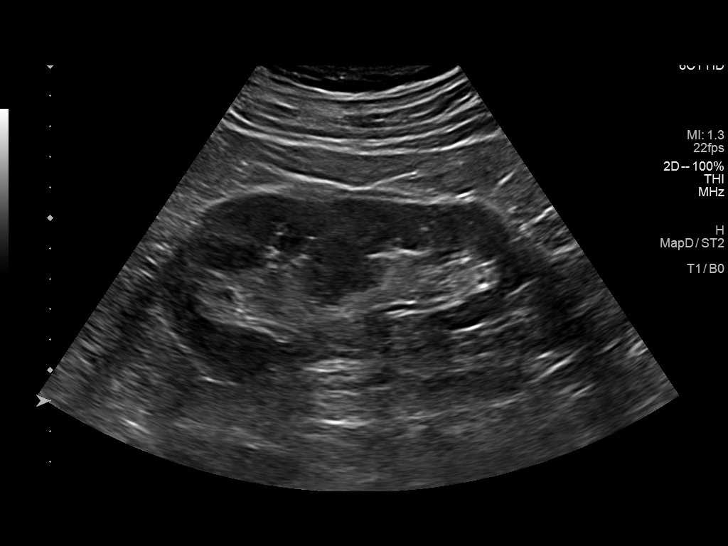
[im 69/103]
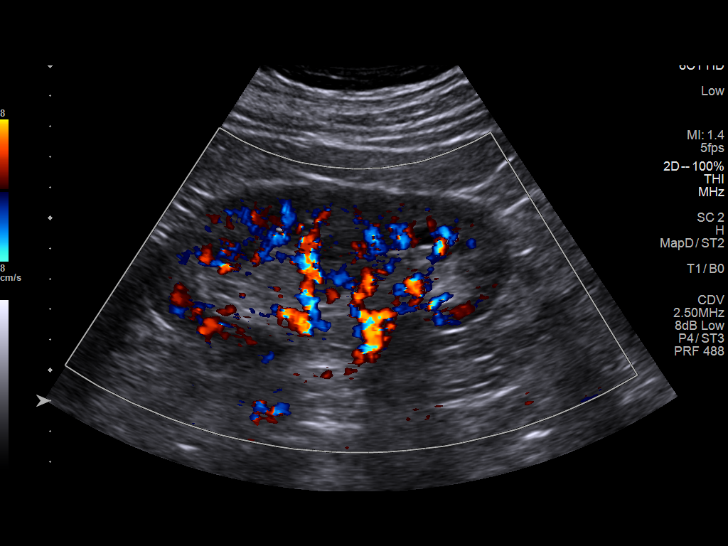
[im 77/103]
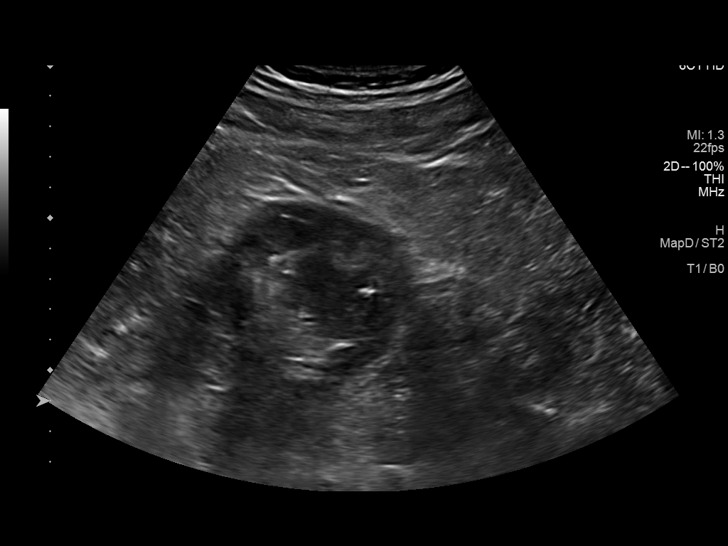
[im 86/103]
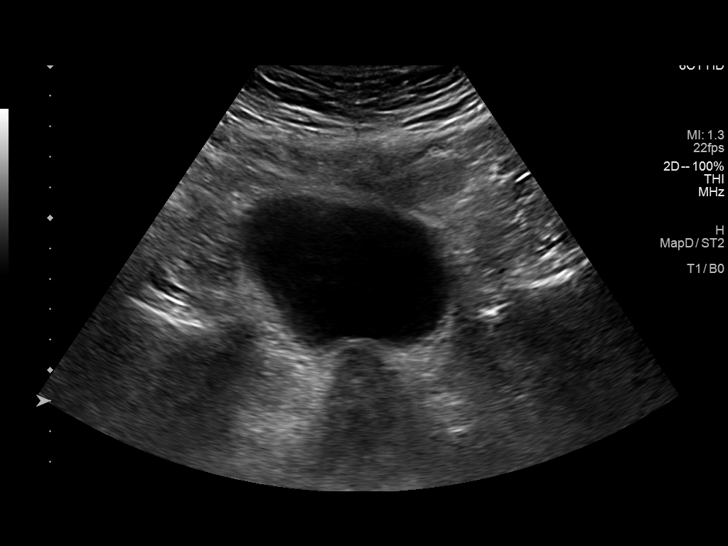
[im 94/103]
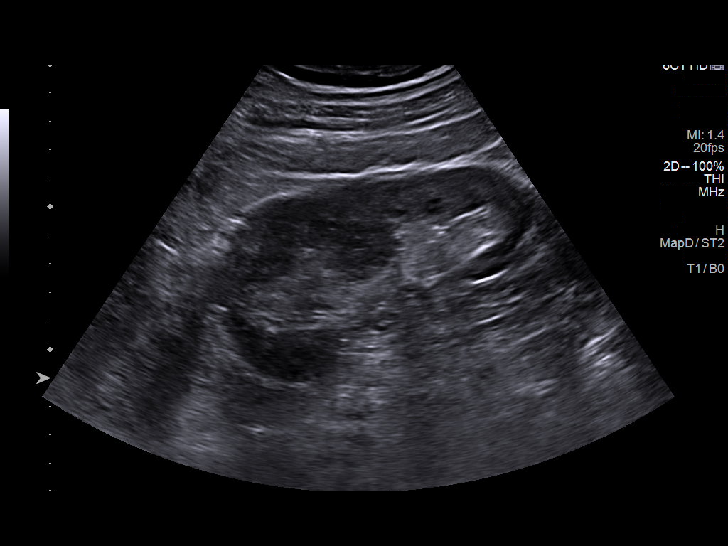
[im 103/103]
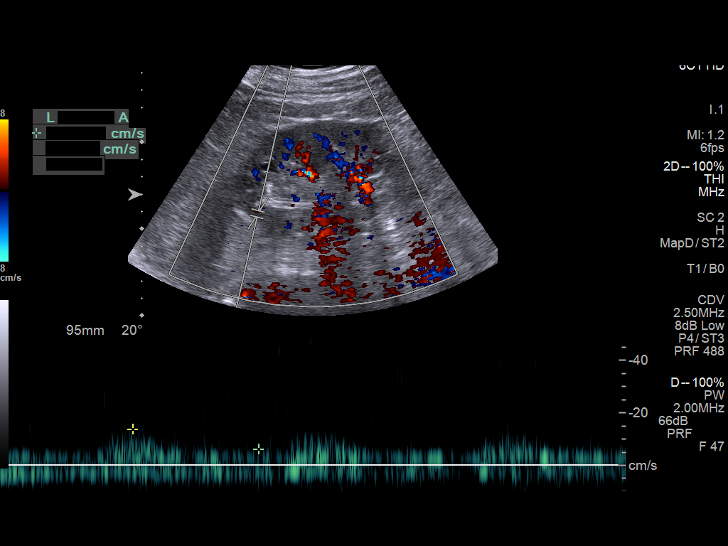

[13 of 25 positions shown; findings below may reference images not displayed]

FINDINGS: Right Kidney:

Length: 11.3 cm x 5.4 cm x 7.6 cm, 248 cc. Anechoic lesion on the
inferior right kidney cortex with no internal complexity or color
flow measures 3.7 cm with through transmission compatible with
simple cyst. Additional hypoechoic lesion in the interpolar region
measures 11 mm. This likely represents additional simple cystic
lesion though is not as well characterized.

No hydronephrosis

Left Kidney:

Length: 1.3 cm x 5.1 cm x 7.3 cm, 255 cc. Rounded soft tissue in the
interpolar region estimated 3 cm with hypoechoic features. This
extends towards the renal pelvis. No comparison is available.

No hydronephrosis.

Bladder:  Unremarkable

RENAL DUPLEX ULTRASOUND

Right Renal Artery Velocities:

Origin:  68 cm/sec

Mid:  102 cm/sec

Hilum:  76 cm/sec

Interlobar:  52 cm/sec

Arcuate:  33 cm/sec

Left Renal Artery Velocities:

Origin:  60 cm/sec

Mid:  63 cm/sec

Hilum:  58 cm/sec

Interlobar:  51 cm/sec

Arcuate:  17 cm/sec

Aortic Velocity:  96 cm/sec

Right Renal-Aortic Ratios:

Origin:

Mid:

Hilum:

Interlobar:

Arcuate:

Left Renal-Aortic Ratios:

Origin:

Mid:

Hilum:

Interlobar:

Arcuate:
IMPRESSION: Directed duplex of the renal arteries negative for evidence of
high-grade stenosis.

Rounded soft tissue of the left kidney in the interpolar region
extending towards the pelvis is incompletely characterized,
estimated 3 cm. This may represent a region of cortical tissue,
however, a renal mass is not excluded. Further evaluation with
contrast-enhanced CT or alternatively MRI recommended.

## 2022-09-01 IMAGING — CT CT CARDIAC CORONARY ARTERY CALCIUM SCORE
3 series · 14 of 20 positions shown, 16 images · non-contrast
Comparison: None.

CLINICAL DATA: 74-year-old Caucasian male with hyperlipidemia.

EXAM:
CT CARDIAC CORONARY ARTERY CALCIUM SCORE
TECHNIQUE: Non-contrast imaging through the heart was performed using
prospective ECG gating. Image post processing was performed on an
independent workstation, allowing for quantitative analysis of the
heart and coronary arteries. Note that this exam targets the heart
and the chest was not imaged in its entirety.

[Series 2: calcium scoring 2.00 qr36 bestdiast 70% hrt calciu · axial · 0.47mm/px · z∈[+1756,+1816]mm · 4 of 50 slices shown]
[im 10/50  vessel]
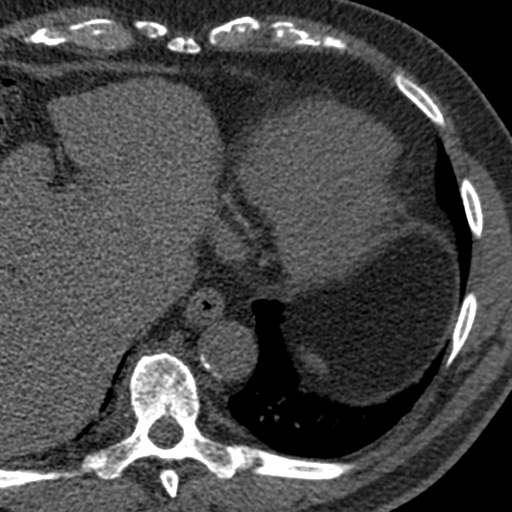
[im 20/50  vessel]
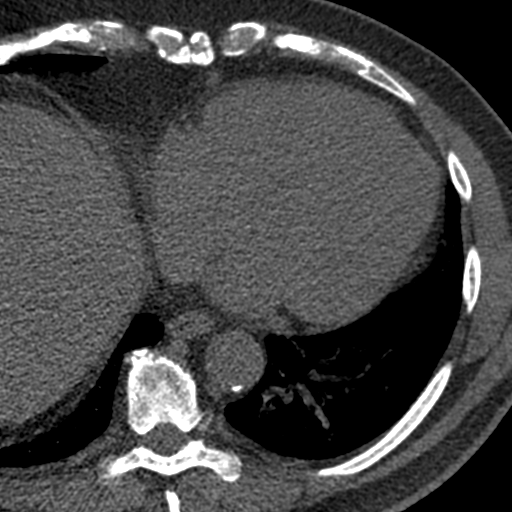
[im 30/50  vessel]
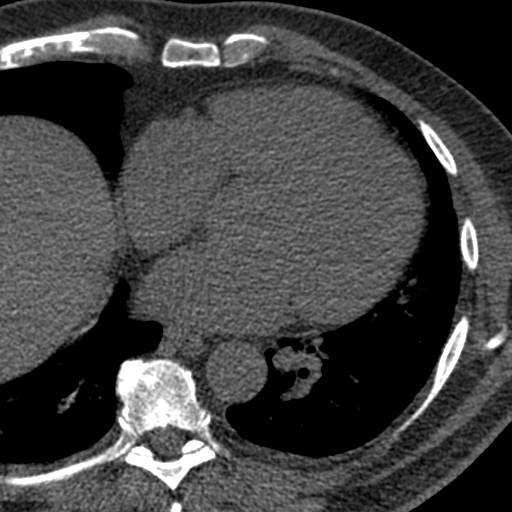
[im 40/50  vessel]
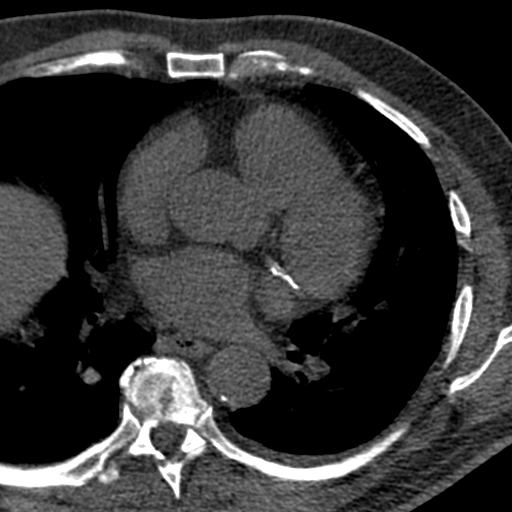

[Series 3: calcium scoring 2.00 br40 bestdiast 70% axial · axial · 0.63mm/px · z∈[+1754,+1818]mm · 5 of 50 slices shown, 7 images]
[im 9/50  vessel]
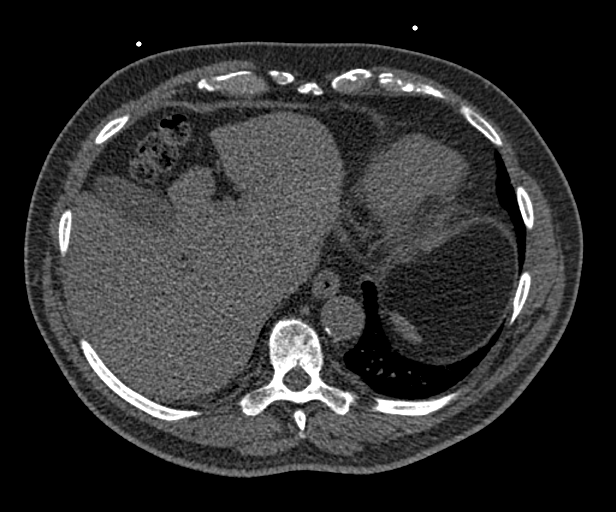
[im 9/50  lung]
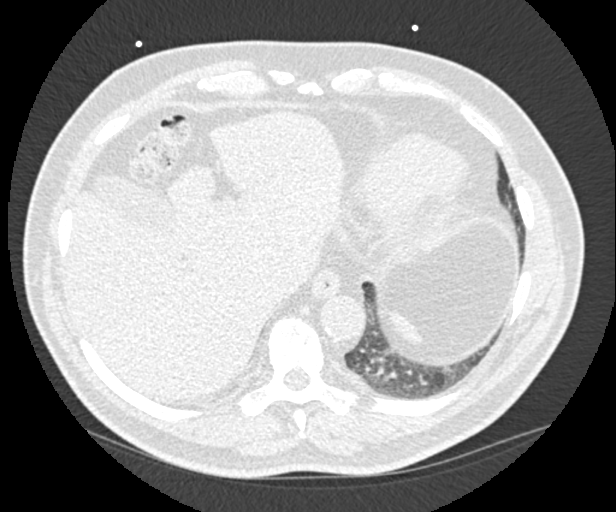
[im 17/50  vessel]
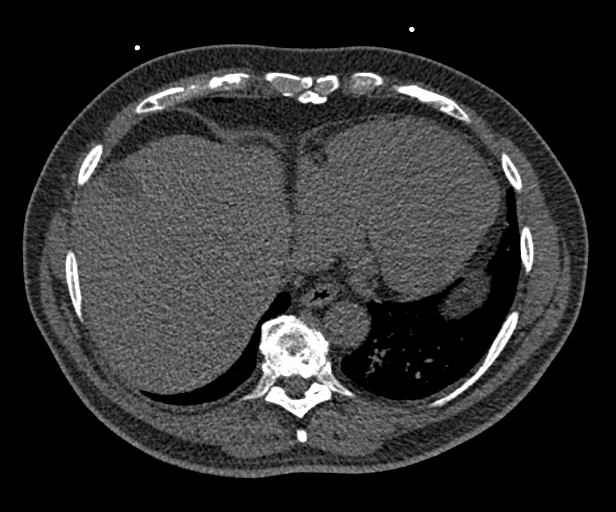
[im 25/50  vessel]
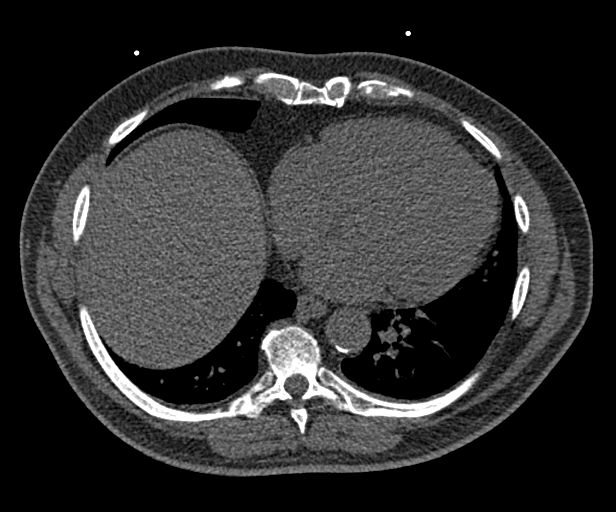
[im 33/50  vessel]
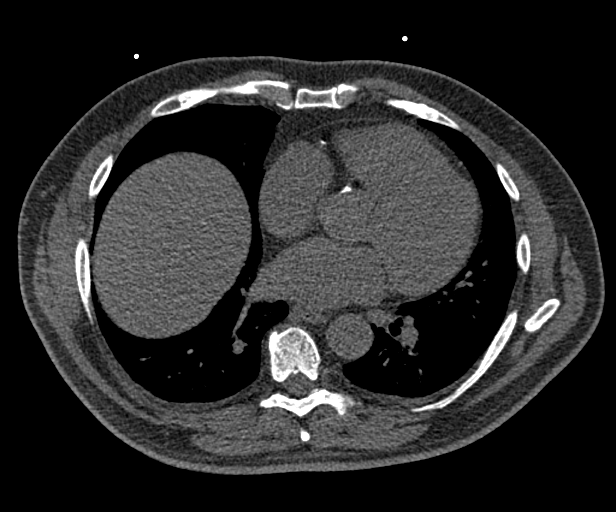
[im 41/50  vessel]
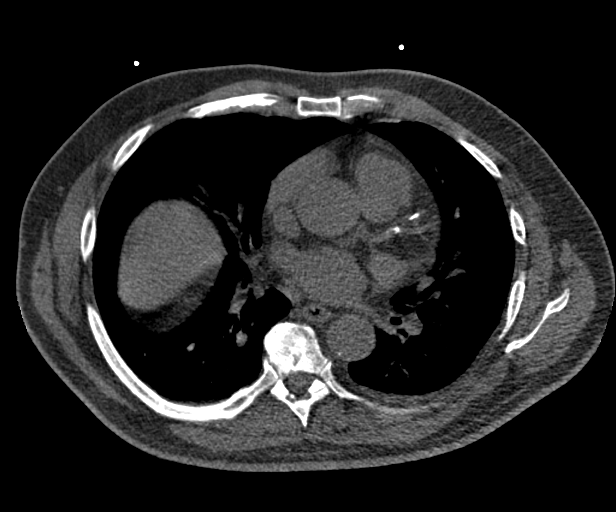
[im 41/50  lung]
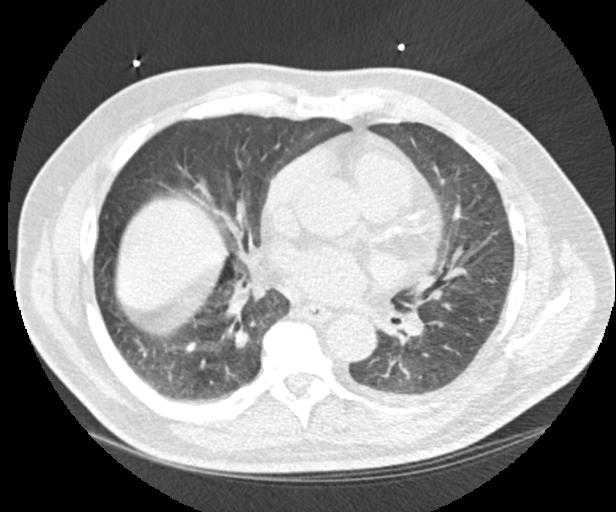

[Series 9: calcium scoring 2.00 br60 bestdiast 70% lungs · axial · 0.63mm/px · z∈[+1754,+1818]mm · 5 of 50 slices shown]
[im 9/50  vessel]
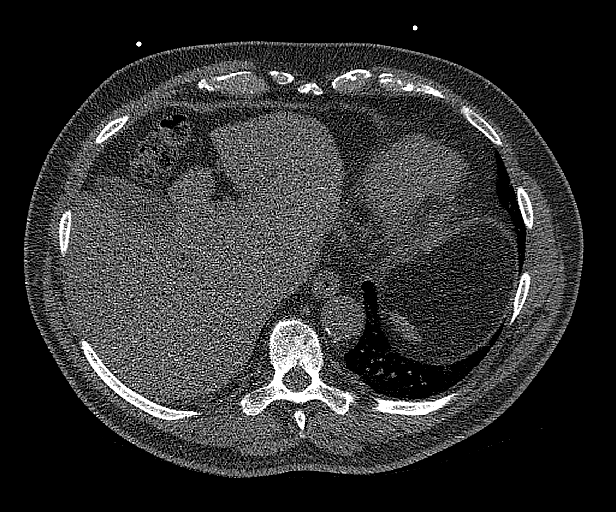
[im 17/50  vessel]
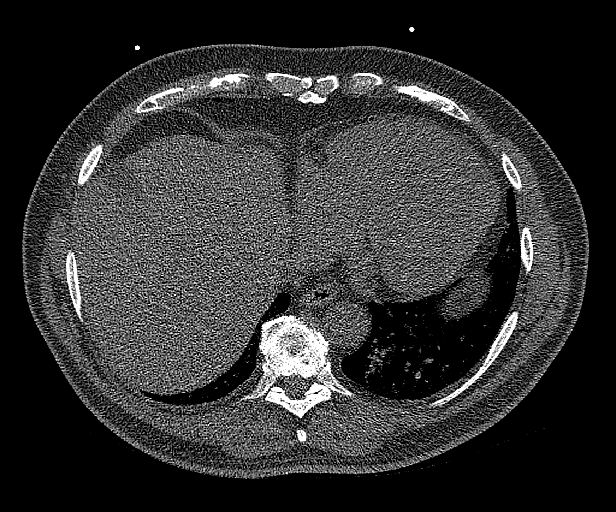
[im 25/50  vessel]
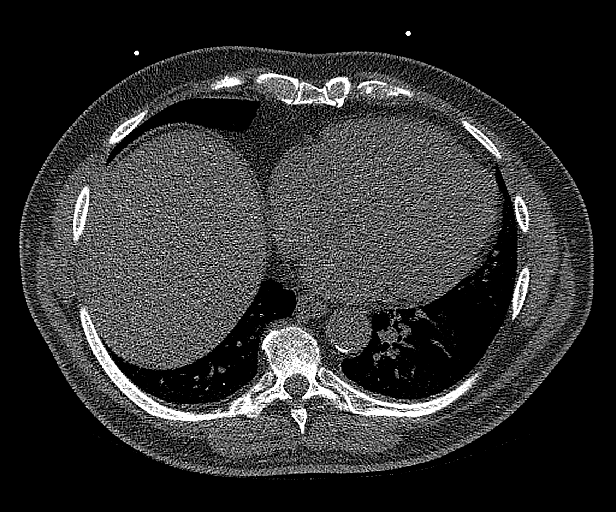
[im 33/50  vessel]
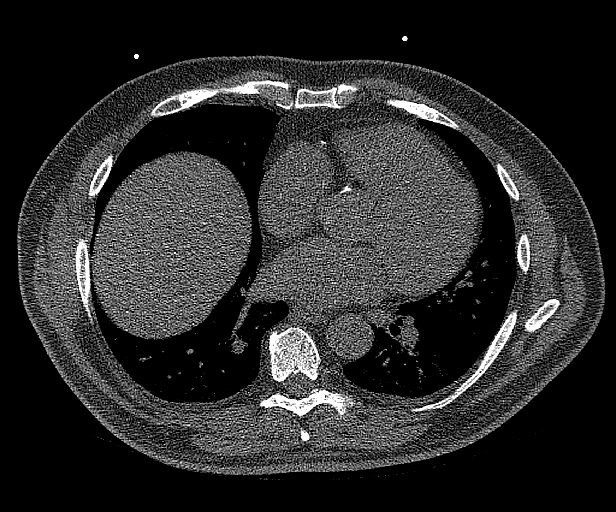
[im 41/50  vessel]
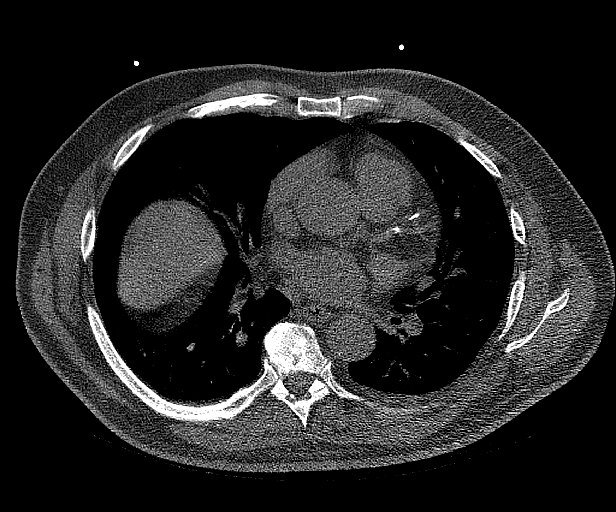

[14 of 20 positions shown; findings below may reference images not displayed]

FINDINGS: Technical quality: Good

CORONARY CALCIUM SCORES:

Left Main: No coronary artery calcification

LAD: 232

LCx: 311

RCA: 46

CORONARY CALCIUM

Total Agatston Score: 589

[HOSPITAL] percentile: 66

Ascending aorta (normal <  40 mm): 38

mm

EXTRACARDIAC FINDINGS:

Limited view of the lung parenchyma demonstrates no suspicious
nodularity. Airways are normal.

Limited view of the mediastinum demonstrates no adenopathy.
Esophagus normal.

Limited view of the upper abdomen demonstrates gallstones. No
evidence cholecystitis.

Limited view of the skeleton and chest wall is unremarkable.
IMPRESSION: 1. Three-vessel coronary artery calcification.

2. Total Agatston Score: 589

3. MESA age and sex matched database percentile: 66

4.  Incidental finding of cholelithiasis.

## 2022-09-24 ENCOUNTER — Other Ambulatory Visit: Payer: Self-pay | Admitting: Internal Medicine

## 2022-10-13 ENCOUNTER — Other Ambulatory Visit: Payer: Self-pay | Admitting: Internal Medicine

## 2022-10-26 ENCOUNTER — Other Ambulatory Visit: Payer: Self-pay | Admitting: Internal Medicine

## 2022-11-07 DIAGNOSIS — H40023 Open angle with borderline findings, high risk, bilateral: Secondary | ICD-10-CM | POA: Diagnosis not present

## 2022-11-14 ENCOUNTER — Encounter (HOSPITAL_BASED_OUTPATIENT_CLINIC_OR_DEPARTMENT_OTHER): Payer: Self-pay | Admitting: Emergency Medicine

## 2022-11-14 ENCOUNTER — Emergency Department (HOSPITAL_BASED_OUTPATIENT_CLINIC_OR_DEPARTMENT_OTHER)
Admission: EM | Admit: 2022-11-14 | Discharge: 2022-11-14 | Disposition: A | Discharge status: Home / Self Care | Payer: Medicare Other | Attending: Emergency Medicine | Admitting: Emergency Medicine

## 2022-11-14 ENCOUNTER — Emergency Department (HOSPITAL_BASED_OUTPATIENT_CLINIC_OR_DEPARTMENT_OTHER): Payer: Medicare Other

## 2022-11-14 ENCOUNTER — Other Ambulatory Visit: Payer: Self-pay

## 2022-11-14 DIAGNOSIS — S0031XA Abrasion of nose, initial encounter: Secondary | ICD-10-CM | POA: Insufficient documentation

## 2022-11-14 DIAGNOSIS — Z79899 Other long term (current) drug therapy: Secondary | ICD-10-CM | POA: Insufficient documentation

## 2022-11-14 DIAGNOSIS — Z7982 Long term (current) use of aspirin: Secondary | ICD-10-CM | POA: Diagnosis not present

## 2022-11-14 DIAGNOSIS — I10 Essential (primary) hypertension: Secondary | ICD-10-CM | POA: Diagnosis not present

## 2022-11-14 DIAGNOSIS — S0033XA Contusion of nose, initial encounter: Secondary | ICD-10-CM | POA: Diagnosis not present

## 2022-11-14 DIAGNOSIS — S0990XA Unspecified injury of head, initial encounter: Secondary | ICD-10-CM | POA: Diagnosis not present

## 2022-11-14 DIAGNOSIS — S199XXA Unspecified injury of neck, initial encounter: Secondary | ICD-10-CM | POA: Diagnosis not present

## 2022-11-14 DIAGNOSIS — W010XXA Fall on same level from slipping, tripping and stumbling without subsequent striking against object, initial encounter: Secondary | ICD-10-CM | POA: Diagnosis not present

## 2022-11-14 DIAGNOSIS — W19XXXA Unspecified fall, initial encounter: Secondary | ICD-10-CM

## 2022-11-14 DIAGNOSIS — W1830XA Fall on same level, unspecified, initial encounter: Secondary | ICD-10-CM | POA: Diagnosis not present

## 2022-11-14 MED ORDER — OXYMETAZOLINE HCL 0.05 % NA SOLN
1.0000 | Freq: Once | NASAL | Status: AC
  Administered 2022-11-14: 1 via NASAL
  Filled 2022-11-14: qty 30

## 2022-11-14 NOTE — ED Triage Notes (Signed)
Trip, fall while doing yard work. Abrasion on nose. Nose bleed after fall but now has stopped. Asa daily. No loc, denies other injury. Ambulatory to triage. Happened around 3pm. Seen at [facility] per patient

## 2022-11-14 NOTE — Discharge Instructions (Signed)
Thank you for letting us take care of you today.  Your CT scans were negative.  Please continue to keep the wound on your face clean and dry.  We used Afrin spray in your nose to help prevent any further nosebleed.  It is okay to use this at home if you have any recurrent nosebleed but I do not recommend using it regularly or more than a couple times as it can lead to problems with your blood pressure and worsening bleeding.  Please follow-up with your PCP if you have any lingering pain or other symptoms next week.  If you develop any new or worsening symptoms such as severe headache, vision changes, weakness on one side of your body or another, lightheadedness, dizziness, chest pain, shortness of breath, or other new, concerning symptoms, please return to the nearest emergency department for reevaluation.

## 2022-11-15 NOTE — ED Provider Notes (Signed)
[facility] Provider Note   CSN: [id number] Arrival date & time: 11/14/22  1549     History  Chief Complaint  Patient presents with   Edward Bush is a 77 y.o. male with past medical history hypertension and hyperlipidemia who presents to the ED after a mechanical fall.  Patient reports that he was completing home repairs when he accidentally tripped and fell hitting his face.  He did not lose consciousness.  He denies headache, nausea, vomiting, neck pain, back pain, extremity pain, chest pain, abdominal pain, or any active complaints.  He reports that he was evaluated at urgent care for his fall after sustaining an abrasion to his nose and I recommended that he come to the ED to have a CT scan to rule out any intracranial bleed.  Immediately following the fall, patient did have brief, mild bleeding from his nostrils that resolved.  Patient is not on anticoagulants.  He states that he is feeling in his normal state of health.       Home Medications Prior to Admission medications   Medication Sig Start Date End Date Taking? Authorizing Provider  amLODipine (NORVASC) 10 MG tablet Take 1 tablet (10 mg total) by mouth daily. PATIENT MUST SCHEDULE ANNUAL VISIT FOR FUTURE REFILLS 10/28/22   Troy Sine, MD  aspirin EC 81 MG tablet Take 81 mg by mouth daily.      [provider]  atorvastatin (LIPITOR) 10 MG tablet 1 tablet 02/13/21   [provider]  carvedilol (COREG) 25 MG tablet TAKE 1 TABLET BY MOUTH TWICE  DAILY WITH MEALS 10/14/22   Hilty, Nadean Corwin, MD  hydrALAZINE (APRESOLINE) 25 MG tablet TAKE 1 TABLET BY MOUTH TWICE  DAILY 09/26/22   Hilty, Nadean Corwin, MD  latanoprost (XALATAN) 0.005 % ophthalmic solution 1 drop at bedtime.    [provider]  olmesartan-hydrochlorothiazide (BENICAR HCT) 40-25 MG tablet Take 1 tablet by mouth daily. 04/07/21   [provider]  POTASSIUM PO Take 1 tablet by mouth  daily. 595mg  qd    [provider]      Allergies    Sulfa antibiotics    Review of Systems   Review of Systems  All other systems reviewed and are negative.   Physical Exam Updated Vital Signs BP (!) 162/77 (BP Location: Right Arm)   Pulse 62   Temp (!) 97.4 F (36.3 C)   Resp 18   SpO2 94%  Physical Exam Vitals and nursing note reviewed.  Constitutional:      General: He is not in acute distress.    Appearance: Normal appearance. He is not ill-appearing, toxic-appearing or diaphoretic.  HENT:     Head: Normocephalic and atraumatic.     Nose:     Comments: Abrasion over nose, no active bleeding, no deformity    Mouth/Throat:     Mouth: Mucous membranes are moist.  Eyes:     Extraocular Movements: Extraocular movements intact.     Conjunctiva/sclera: Conjunctivae normal.     Pupils: Pupils are equal, round, and reactive to light.  Neck:     Comments: No step-offs or deformities Cardiovascular:     Rate and Rhythm: Normal rate and regular rhythm.     Heart sounds: No murmur heard. Pulmonary:     Effort: Pulmonary effort is normal.     Breath sounds: Normal breath sounds.  Chest:     Chest wall: No tenderness.  Abdominal:     General: Abdomen is flat. There is no distension.     Palpations: Abdomen is soft.     Tenderness: There is no abdominal tenderness. There is no guarding or rebound.  Musculoskeletal:        General: No deformity. Normal range of motion.     Cervical back: Normal range of motion and neck supple. No rigidity or tenderness.     Right lower leg: No edema.     Left lower leg: No edema.     Comments: No midline thoracic or lumbar spinal tenderness, step-offs, or deformities, moving all 4 extremities spontaneously and equally, 5/5 strength to bilateral upper and lower extremities, normal gait  Skin:    General: Skin is warm and dry.     Capillary Refill: Capillary refill takes less than 2 seconds.  Neurological:     General: No focal  deficit present.     Mental Status: He is alert and oriented to person, place, and time.     GCS: GCS eye subscore is 4. GCS verbal subscore is 5. GCS motor subscore is 6.     Cranial Nerves: Cranial nerves 2-12 are intact. No cranial nerve deficit, dysarthria or facial asymmetry.     Sensory: Sensation is intact. No sensory deficit.     Motor: Motor function is intact. No weakness, tremor, atrophy, abnormal muscle tone or seizure activity.     Coordination: Coordination is intact.     Gait: Gait is intact. Gait normal.  Psychiatric:        Mood and Affect: Mood normal.        Behavior: Behavior normal.     ED Results / Procedures / Treatments   Labs (all labs ordered are listed, but only abnormal results are displayed) Labs Reviewed - No data to display  EKG None  Radiology CT Head Wo Contrast  Result Date: 11/14/2022 CLINICAL DATA:  Head trauma, minor (Age >= 65y); Neck trauma (Age >= 65y); Facial trauma, blunt. trip, fall while doing yard work. Abrasion on nose. Nose bleed after fall but now has stopped. Asa daily. No loc, denies other injury EXAM: CT HEAD WITHOUT CONTRAST CT MAXILLOFACIAL WITHOUT CONTRAST CT CERVICAL SPINE WITHOUT CONTRAST TECHNIQUE: Multidetector CT imaging of the head, cervical spine, and maxillofacial structures were performed using the standard protocol without intravenous contrast. Multiplanar CT image reconstructions of the cervical spine and maxillofacial structures were also generated. RADIATION DOSE REDUCTION: This exam was performed according to the departmental dose-optimization program which includes automated exposure control, adjustment of the mA and/or kV according to patient size and/or use of iterative reconstruction technique. COMPARISON:  MRI head 11/17/2020 FINDINGS: CT HEAD FINDINGS Brain: Patchy and confluent areas of decreased attenuation are noted throughout the deep and periventricular white matter of the cerebral hemispheres bilaterally,  compatible with chronic microvascular ischemic disease. No evidence of large-territorial acute infarction. No parenchymal hemorrhage. No mass lesion. No extra-axial collection. No mass effect or midline shift. No hydrocephalus. Basilar cisterns are patent. Vascular: No hyperdense vessel. Atherosclerotic calcifications are present within the cavernous internal carotid and vertebral arteries. Skull: No acute fracture or focal lesion. Other: None. CT MAXILLOFACIAL FINDINGS Osseous: No fracture or mandibular dislocation. No destructive process. Patient is edentulous. Sinuses/Orbits: Paranasal sinuses and mastoid air cells are clear. Bilateral lens replacement. Otherwise the orbits are unremarkable. Soft tissues: Negative. CT CERVICAL SPINE FINDINGS Alignment: Normal. Skull base and vertebrae: Multilevel moderate degenerative changes spine. Associated multilevel severe osseous neural foraminal stenosis. No osseous  severe central canal stenosis no acute fracture. No aggressive appearing focal osseous lesion or focal pathologic process. Soft tissues and spinal canal: No prevertebral fluid or swelling. No visible canal hematoma. Upper chest: Unremarkable. Other: Atherosclerotic plaque of the carotid arteries within the neck. IMPRESSION: 1. No acute intracranial abnormality. 2.  No acute displaced facial fracture. 3. No acute displaced fracture or traumatic listhesis of the cervical spine. 4. ultilevel moderate degenerative changes spine. Associated multilevel severe osseous neural foraminal stenosis. Electronically Signed   By: Iven Finn M.D.   On: 11/14/2022 18:47   CT Cervical Spine Wo Contrast  Result Date: 11/14/2022 CLINICAL DATA:  Head trauma, minor (Age >= 65y); Neck trauma (Age >= 65y); Facial trauma, blunt. trip, fall while doing yard work. Abrasion on nose. Nose bleed after fall but now has stopped. Asa daily. No loc, denies other injury EXAM: CT HEAD WITHOUT CONTRAST CT MAXILLOFACIAL WITHOUT CONTRAST CT  CERVICAL SPINE WITHOUT CONTRAST TECHNIQUE: Multidetector CT imaging of the head, cervical spine, and maxillofacial structures were performed using the standard protocol without intravenous contrast. Multiplanar CT image reconstructions of the cervical spine and maxillofacial structures were also generated. RADIATION DOSE REDUCTION: This exam was performed according to the departmental dose-optimization program which includes automated exposure control, adjustment of the mA and/or kV according to patient size and/or use of iterative reconstruction technique. COMPARISON:  MRI head 11/17/2020 FINDINGS: CT HEAD FINDINGS Brain: Patchy and confluent areas of decreased attenuation are noted throughout the deep and periventricular white matter of the cerebral hemispheres bilaterally, compatible with chronic microvascular ischemic disease. No evidence of large-territorial acute infarction. No parenchymal hemorrhage. No mass lesion. No extra-axial collection. No mass effect or midline shift. No hydrocephalus. Basilar cisterns are patent. Vascular: No hyperdense vessel. Atherosclerotic calcifications are present within the cavernous internal carotid and vertebral arteries. Skull: No acute fracture or focal lesion. Other: None. CT MAXILLOFACIAL FINDINGS Osseous: No fracture or mandibular dislocation. No destructive process. Patient is edentulous. Sinuses/Orbits: Paranasal sinuses and mastoid air cells are clear. Bilateral lens replacement. Otherwise the orbits are unremarkable. Soft tissues: Negative. CT CERVICAL SPINE FINDINGS Alignment: Normal. Skull base and vertebrae: Multilevel moderate degenerative changes spine. Associated multilevel severe osseous neural foraminal stenosis. No osseous severe central canal stenosis no acute fracture. No aggressive appearing focal osseous lesion or focal pathologic process. Soft tissues and spinal canal: No prevertebral fluid or swelling. No visible canal hematoma. Upper chest:  Unremarkable. Other: Atherosclerotic plaque of the carotid arteries within the neck. IMPRESSION: 1. No acute intracranial abnormality. 2.  No acute displaced facial fracture. 3. No acute displaced fracture or traumatic listhesis of the cervical spine. 4. ultilevel moderate degenerative changes spine. Associated multilevel severe osseous neural foraminal stenosis. Electronically Signed   By: Iven Finn M.D.   On: 11/14/2022 18:47   CT Maxillofacial Wo Contrast  Result Date: 11/14/2022 CLINICAL DATA:  Head trauma, minor (Age >= 65y); Neck trauma (Age >= 65y); Facial trauma, blunt. trip, fall while doing yard work. Abrasion on nose. Nose bleed after fall but now has stopped. Asa daily. No loc, denies other injury EXAM: CT HEAD WITHOUT CONTRAST CT MAXILLOFACIAL WITHOUT CONTRAST CT CERVICAL SPINE WITHOUT CONTRAST TECHNIQUE: Multidetector CT imaging of the head, cervical spine, and maxillofacial structures were performed using the standard protocol without intravenous contrast. Multiplanar CT image reconstructions of the cervical spine and maxillofacial structures were also generated. RADIATION DOSE REDUCTION: This exam was performed according to the departmental dose-optimization program which includes automated exposure control, adjustment of the mA and/or  kV according to patient size and/or use of iterative reconstruction technique. COMPARISON:  MRI head 11/17/2020 FINDINGS: CT HEAD FINDINGS Brain: Patchy and confluent areas of decreased attenuation are noted throughout the deep and periventricular white matter of the cerebral hemispheres bilaterally, compatible with chronic microvascular ischemic disease. No evidence of large-territorial acute infarction. No parenchymal hemorrhage. No mass lesion. No extra-axial collection. No mass effect or midline shift. No hydrocephalus. Basilar cisterns are patent. Vascular: No hyperdense vessel. Atherosclerotic calcifications are present within the cavernous internal  carotid and vertebral arteries. Skull: No acute fracture or focal lesion. Other: None. CT MAXILLOFACIAL FINDINGS Osseous: No fracture or mandibular dislocation. No destructive process. Patient is edentulous. Sinuses/Orbits: Paranasal sinuses and mastoid air cells are clear. Bilateral lens replacement. Otherwise the orbits are unremarkable. Soft tissues: Negative. CT CERVICAL SPINE FINDINGS Alignment: Normal. Skull base and vertebrae: Multilevel moderate degenerative changes spine. Associated multilevel severe osseous neural foraminal stenosis. No osseous severe central canal stenosis no acute fracture. No aggressive appearing focal osseous lesion or focal pathologic process. Soft tissues and spinal canal: No prevertebral fluid or swelling. No visible canal hematoma. Upper chest: Unremarkable. Other: Atherosclerotic plaque of the carotid arteries within the neck. IMPRESSION: 1. No acute intracranial abnormality. 2.  No acute displaced facial fracture. 3. No acute displaced fracture or traumatic listhesis of the cervical spine. 4. ultilevel moderate degenerative changes spine. Associated multilevel severe osseous neural foraminal stenosis. Electronically Signed   By: Iven Finn M.D.   On: 11/14/2022 18:47    Procedures Procedures    Medications Ordered in ED Medications  oxymetazoline (AFRIN) 0.05 % nasal spray 1 spray (1 spray Each Nare Given 11/14/22 1910)    ED Course/ Medical Decision Making/ A&P                             Medical Decision Making Amount and/or Complexity of Data Reviewed Radiology: ordered. Decision-making details documented in ED Course.   Medical Decision Making:   TATUM SRADER is a 77 y.o. male who presented to the ED today with mechanical fall detailed above.    Patient's presentation is complicated by their history of advanced age, hypertension, hyperlipidemia.  Complete initial physical exam performed, notably the patient  was in no acute distress.  He had an  intact neurological exam with 5/5 strength to bilateral upper and lower extremities, cranial nerves intact, normal gait, normal speech.  Moving all 4 extremities equally and spontaneously with no deformity.  Abdomen soft and nontender.  No tenderness to the chest wall.  No spinal tenderness or deformities.  Superficial abrasion to the nose with no active bleeding, no other signs of head trauma.    Reviewed and confirmed nursing documentation for past medical history, family history, social history.    Initial Assessment:   With the patient's presentation of mechanical fall, differential diagnosis includes but is not limited to Berger, fracture, dislocation, disk herniation, contusion, abrasion. This is most consistent with an acute complicated illness  Initial Plan:  CT brain, maxillofacial, and cervical spine to assess for traumatic injuries Afrin spray to help prevent further nosebleed Patient currently asymptomatic and declining other medication treatment during ED visit Objective evaluation as reviewed   Initial Study Results:   Radiology:  All images reviewed independently. Agree with radiology report at this time.   CT Head Wo Contrast  Result Date: 11/14/2022 CLINICAL DATA:  Head trauma, minor (Age >= 65y); Neck trauma (Age >= 65y); Facial  trauma, blunt. trip, fall while doing yard work. Abrasion on nose. Nose bleed after fall but now has stopped. Asa daily. No loc, denies other injury EXAM: CT HEAD WITHOUT CONTRAST CT MAXILLOFACIAL WITHOUT CONTRAST CT CERVICAL SPINE WITHOUT CONTRAST TECHNIQUE: Multidetector CT imaging of the head, cervical spine, and maxillofacial structures were performed using the standard protocol without intravenous contrast. Multiplanar CT image reconstructions of the cervical spine and maxillofacial structures were also generated. RADIATION DOSE REDUCTION: This exam was performed according to the departmental dose-optimization program which includes automated exposure  control, adjustment of the mA and/or kV according to patient size and/or use of iterative reconstruction technique. COMPARISON:  MRI head 11/17/2020 FINDINGS: CT HEAD FINDINGS Brain: Patchy and confluent areas of decreased attenuation are noted throughout the deep and periventricular white matter of the cerebral hemispheres bilaterally, compatible with chronic microvascular ischemic disease. No evidence of large-territorial acute infarction. No parenchymal hemorrhage. No mass lesion. No extra-axial collection. No mass effect or midline shift. No hydrocephalus. Basilar cisterns are patent. Vascular: No hyperdense vessel. Atherosclerotic calcifications are present within the cavernous internal carotid and vertebral arteries. Skull: No acute fracture or focal lesion. Other: None. CT MAXILLOFACIAL FINDINGS Osseous: No fracture or mandibular dislocation. No destructive process. Patient is edentulous. Sinuses/Orbits: Paranasal sinuses and mastoid air cells are clear. Bilateral lens replacement. Otherwise the orbits are unremarkable. Soft tissues: Negative. CT CERVICAL SPINE FINDINGS Alignment: Normal. Skull base and vertebrae: Multilevel moderate degenerative changes spine. Associated multilevel severe osseous neural foraminal stenosis. No osseous severe central canal stenosis no acute fracture. No aggressive appearing focal osseous lesion or focal pathologic process. Soft tissues and spinal canal: No prevertebral fluid or swelling. No visible canal hematoma. Upper chest: Unremarkable. Other: Atherosclerotic plaque of the carotid arteries within the neck. IMPRESSION: 1. No acute intracranial abnormality. 2.  No acute displaced facial fracture. 3. No acute displaced fracture or traumatic listhesis of the cervical spine. 4. ultilevel moderate degenerative changes spine. Associated multilevel severe osseous neural foraminal stenosis. Electronically Signed   By: Iven Finn M.D.   On: 11/14/2022 18:47   CT Cervical  Spine Wo Contrast  Result Date: 11/14/2022 CLINICAL DATA:  Head trauma, minor (Age >= 65y); Neck trauma (Age >= 65y); Facial trauma, blunt. trip, fall while doing yard work. Abrasion on nose. Nose bleed after fall but now has stopped. Asa daily. No loc, denies other injury EXAM: CT HEAD WITHOUT CONTRAST CT MAXILLOFACIAL WITHOUT CONTRAST CT CERVICAL SPINE WITHOUT CONTRAST TECHNIQUE: Multidetector CT imaging of the head, cervical spine, and maxillofacial structures were performed using the standard protocol without intravenous contrast. Multiplanar CT image reconstructions of the cervical spine and maxillofacial structures were also generated. RADIATION DOSE REDUCTION: This exam was performed according to the departmental dose-optimization program which includes automated exposure control, adjustment of the mA and/or kV according to patient size and/or use of iterative reconstruction technique. COMPARISON:  MRI head 11/17/2020 FINDINGS: CT HEAD FINDINGS Brain: Patchy and confluent areas of decreased attenuation are noted throughout the deep and periventricular white matter of the cerebral hemispheres bilaterally, compatible with chronic microvascular ischemic disease. No evidence of large-territorial acute infarction. No parenchymal hemorrhage. No mass lesion. No extra-axial collection. No mass effect or midline shift. No hydrocephalus. Basilar cisterns are patent. Vascular: No hyperdense vessel. Atherosclerotic calcifications are present within the cavernous internal carotid and vertebral arteries. Skull: No acute fracture or focal lesion. Other: None. CT MAXILLOFACIAL FINDINGS Osseous: No fracture or mandibular dislocation. No destructive process. Patient is edentulous. Sinuses/Orbits: Paranasal sinuses and mastoid air  cells are clear. Bilateral lens replacement. Otherwise the orbits are unremarkable. Soft tissues: Negative. CT CERVICAL SPINE FINDINGS Alignment: Normal. Skull base and vertebrae: Multilevel  moderate degenerative changes spine. Associated multilevel severe osseous neural foraminal stenosis. No osseous severe central canal stenosis no acute fracture. No aggressive appearing focal osseous lesion or focal pathologic process. Soft tissues and spinal canal: No prevertebral fluid or swelling. No visible canal hematoma. Upper chest: Unremarkable. Other: Atherosclerotic plaque of the carotid arteries within the neck. IMPRESSION: 1. No acute intracranial abnormality. 2.  No acute displaced facial fracture. 3. No acute displaced fracture or traumatic listhesis of the cervical spine. 4. ultilevel moderate degenerative changes spine. Associated multilevel severe osseous neural foraminal stenosis. Electronically Signed   By: Iven Finn M.D.   On: 11/14/2022 18:47   CT Maxillofacial Wo Contrast  Result Date: 11/14/2022 CLINICAL DATA:  Head trauma, minor (Age >= 65y); Neck trauma (Age >= 65y); Facial trauma, blunt. trip, fall while doing yard work. Abrasion on nose. Nose bleed after fall but now has stopped. Asa daily. No loc, denies other injury EXAM: CT HEAD WITHOUT CONTRAST CT MAXILLOFACIAL WITHOUT CONTRAST CT CERVICAL SPINE WITHOUT CONTRAST TECHNIQUE: Multidetector CT imaging of the head, cervical spine, and maxillofacial structures were performed using the standard protocol without intravenous contrast. Multiplanar CT image reconstructions of the cervical spine and maxillofacial structures were also generated. RADIATION DOSE REDUCTION: This exam was performed according to the departmental dose-optimization program which includes automated exposure control, adjustment of the mA and/or kV according to patient size and/or use of iterative reconstruction technique. COMPARISON:  MRI head 11/17/2020 FINDINGS: CT HEAD FINDINGS Brain: Patchy and confluent areas of decreased attenuation are noted throughout the deep and periventricular white matter of the cerebral hemispheres bilaterally, compatible with chronic  microvascular ischemic disease. No evidence of large-territorial acute infarction. No parenchymal hemorrhage. No mass lesion. No extra-axial collection. No mass effect or midline shift. No hydrocephalus. Basilar cisterns are patent. Vascular: No hyperdense vessel. Atherosclerotic calcifications are present within the cavernous internal carotid and vertebral arteries. Skull: No acute fracture or focal lesion. Other: None. CT MAXILLOFACIAL FINDINGS Osseous: No fracture or mandibular dislocation. No destructive process. Patient is edentulous. Sinuses/Orbits: Paranasal sinuses and mastoid air cells are clear. Bilateral lens replacement. Otherwise the orbits are unremarkable. Soft tissues: Negative. CT CERVICAL SPINE FINDINGS Alignment: Normal. Skull base and vertebrae: Multilevel moderate degenerative changes spine. Associated multilevel severe osseous neural foraminal stenosis. No osseous severe central canal stenosis no acute fracture. No aggressive appearing focal osseous lesion or focal pathologic process. Soft tissues and spinal canal: No prevertebral fluid or swelling. No visible canal hematoma. Upper chest: Unremarkable. Other: Atherosclerotic plaque of the carotid arteries within the neck. IMPRESSION: 1. No acute intracranial abnormality. 2.  No acute displaced facial fracture. 3. No acute displaced fracture or traumatic listhesis of the cervical spine. 4. ultilevel moderate degenerative changes spine. Associated multilevel severe osseous neural foraminal stenosis. Electronically Signed   By: Iven Finn M.D.   On: 11/14/2022 18:47     Final Assessment and Plan:   This is a 77 year old male who presents to the ED after mechanical fall.  Patient reports that he tripped and hit his face on the ground.  He is not anticoagulated.  He did not lose consciousness and has had no other red flag symptoms including severe headache, nausea, vomiting, vision changes, neck pain, or any other active concerns.  Patient  evaluated at urgent care and sent to the ED for CT scan due to  his advanced age.  On exam, patient has a small abrasion that is not actively bleeding over the nose but no obvious deformity.  No other traumatic injuries noted to the head and face.  No midline spinal tenderness, step-offs, or deformities.  No abdominal or chest wall tenderness.  Neurologically intact with equal strength bilaterally.  CT scans obtained as above for further assessment.  Afrin spray used bilaterally to help prevent further nosebleeds patient did not have recurrence of nosebleed in the ED today.  Discussed with patient that he can use the Afrin if he has a recurrence of his nosebleed but should not use it multiple times as it can cause problems with his blood pressure and worsening bleeding if overused.  CT scans were negative.  Updated patient and family at bedside on all findings and patient currently stable to be discharged home.  Strict ED return precautions given, all questions answered, and patient stable for discharge.   Clinical Impression:  1. Fall, initial encounter   2. Injury of head, initial encounter   3. Abrasion of nose, initial encounter      Discharge           Final Clinical Impression(s) / ED Diagnoses Final diagnoses:  Fall, initial encounter  Injury of head, initial encounter  Abrasion of nose, initial encounter    Rx / DC Orders ED Discharge Orders     None         Suzzette Righter, PA-C 11/15/22 [id number]    Gareth Morgan, MD 11/15/22 1246

## 2022-11-16 ENCOUNTER — Other Ambulatory Visit: Payer: Self-pay | Admitting: Internal Medicine

## 2022-11-18 ENCOUNTER — Telehealth: Payer: Self-pay

## 2022-11-18 NOTE — Telephone Encounter (Signed)
        Patient  visited [facility] on 3/28    Telephone encounter attempt :  1st  A HIPAA compliant voice message was left requesting a return call.  Instructed patient to call back .    [facility] [phone] [address], [place],  [address] Phone: [phone] Email: [email or web address]

## 2022-11-19 ENCOUNTER — Telehealth: Payer: Self-pay

## 2022-11-19 NOTE — Telephone Encounter (Signed)
        Patient  visited [facility] on 3/28   Telephone encounter attempt :  2nd  A HIPAA compliant voice message was left requesting a return call.  Instructed patient to call back    [facility] [phone] [address], [place], [place] [address] Phone: [phone] Email: [email or web address]

## 2022-11-23 ENCOUNTER — Other Ambulatory Visit: Payer: Self-pay | Admitting: Cardiovascular Disease

## 2022-11-29 ENCOUNTER — Telehealth: Payer: Self-pay | Admitting: Internal Medicine

## 2022-11-29 MED ORDER — AMLODIPINE BESYLATE 10 MG PO TABS: 10.0000 mg | ORAL_TABLET | Freq: Every day | ORAL | 1 refills | Status: DC

## 2022-11-29 MED ORDER — HYDRALAZINE HCL 25 MG PO TABS: 25.0000 mg | ORAL_TABLET | Freq: Two times a day (BID) | ORAL | 1 refills | Status: DC

## 2022-11-29 MED ORDER — CARVEDILOL 25 MG PO TABS: 25.0000 mg | ORAL_TABLET | Freq: Two times a day (BID) | ORAL | 1 refills | Status: DC

## 2022-11-29 NOTE — Telephone Encounter (Signed)
*  STAT* If patient is at the pharmacy, call can be transferred to refill team.   1. Which medications need to be refilled? (please list name of each medication and dose if known)   hydrALAZINE (APRESOLINE) 25 MG tablet   carvedilol (COREG) 25 MG tablet   amLODipine (NORVASC) 10 MG tablet   2. Which pharmacy/location (including street and city if local pharmacy) is medication to be sent to?  [facility] - [place], [place] - [address]    3. Do they need a 30 day or 90 day supply? 90     Pt scheduled for 01/07/23

## 2022-11-29 NOTE — Telephone Encounter (Signed)
Pt's medications were sent to pt's pharmacy as requested. Confirmation received.  

## 2022-12-28 ENCOUNTER — Other Ambulatory Visit: Payer: Self-pay | Admitting: Internal Medicine

## 2023-01-04 ENCOUNTER — Other Ambulatory Visit: Payer: Self-pay | Admitting: Internal Medicine

## 2023-01-07 ENCOUNTER — Ambulatory Visit: Payer: Medicare Other | Attending: Student | Admitting: Student

## 2023-01-07 ENCOUNTER — Encounter: Payer: Self-pay | Admitting: Student

## 2023-01-07 VITALS — BP 136/78 | HR 55 | Ht 69.0 in | Wt 200.8 lb

## 2023-01-07 DIAGNOSIS — Z789 Other specified health status: Secondary | ICD-10-CM

## 2023-01-07 DIAGNOSIS — I2510 Atherosclerotic heart disease of native coronary artery without angina pectoris: Secondary | ICD-10-CM

## 2023-01-07 DIAGNOSIS — I10 Essential (primary) hypertension: Secondary | ICD-10-CM | POA: Diagnosis not present

## 2023-01-07 DIAGNOSIS — E785 Hyperlipidemia, unspecified: Secondary | ICD-10-CM

## 2023-01-07 MED ORDER — AMLODIPINE BESYLATE 10 MG PO TABS: 10.0000 mg | ORAL_TABLET | Freq: Every day | ORAL | 3 refills | Status: DC

## 2023-01-07 MED ORDER — CARVEDILOL 25 MG PO TABS: 25.0000 mg | ORAL_TABLET | Freq: Two times a day (BID) | ORAL | 3 refills | Status: DC

## 2023-01-07 NOTE — Patient Instructions (Signed)
Medication Instructions:  Your physician recommends that you continue on your current medications as directed. Please refer to the Current Medication list given to you today.  *If you need a refill on your cardiac medications before your next appointment, please call your pharmacy*   Lab Work: NONE If you have labs (blood work) drawn today and your tests are completely normal, you will receive your results only by: MyChart Message (if you have MyChart) OR A paper copy in the mail If you have any lab test that is abnormal or we need to change your treatment, we will call you to review the results.   Testing/Procedures: NONE   Follow-Up: At [facility], you and your health needs are our priority.  As part of our continuing mission to provide you with exceptional heart care, we have created designated Provider Care Teams.  These Care Teams include your primary Cardiologist (physician) and Advanced Practice Providers (APPs -  Physician Assistants and Nurse Practitioners) who all work together to provide you with the care you need, when you need it.  We recommend signing up for the patient portal called "MyChart".  Sign up information is provided on this After Visit Summary.  MyChart is used to connect with patients for Virtual Visits (Telemedicine).  Patients are able to view lab/test results, encounter notes, upcoming appointments, etc.  Non-urgent messages can be sent to your provider as well.   To learn more about what you can do with MyChart, go to [email or web address].    Your next appointment:   1 year(s)  Provider:   Kenneth C Hilty, MD    

## 2023-01-07 NOTE — Progress Notes (Signed)
   Cardiology Clinic Note   Date: 01/07/2023 ID: EIN BUDZ, DOB February 08, 1946, MRN [id number]  Primary Cardiologist:  Chrystie Nose, MD  Patient Profile    Edward Bush is a 77 y.o. male who presents to the clinic today for routine follow up.   Past medical history significant for: Nonobstructive CAD. CT cardiac scoring 01/26/2021: Calcium score 589.  Three-vessel coronary artery calcification. Hypertension. Hyperlipidemia. Lipid panel 04/02/2022: LDL 84, HDL 36, TG 129, total 146. Erectile dysfunction.   History of Present Illness    Edward Bush is followed by Dr. Rennis Golden for the above outlined history.  Patient was last seen in the office by Judy Pimple, PA-C on 04/25/2021 for follow-up.  He was doing well at that time and no medication changes were made.  Today, patient is here alone.  He has no complaints today. Patient denies shortness of breath or dyspnea on exertion. No chest pain, pressure, or tightness. Denies orthopnea or PND.  He reports chronic bilateral ankle edema that is not bothersome to him.  No palpitations.  He does a lot of walking for exercise. He walks his dog every day for at least 1 mile. 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    ROS: All other systems reviewed and are otherwise negative except as noted in History of Present Illness.  Studies Reviewed    ECG personally reviewed by me today: Sinus bradycardia, 55 bpm.  No significant changes from 04/25/2021.   Physical Exam    VS:  BP (!) 152/80   Pulse (!) 55   Ht 5\' 9"  (1.753 m)   Wt 200 lb 12.8 oz (91.1 kg)   SpO2 96%   BMI 29.65 kg/m  , BMI Body mass index is 29.65 kg/m.  GEN: Well nourished, well developed, in no acute distress. Neck: No JVD or carotid bruits. Cardiac:  RRR. No murmurs. No rubs or gallops.   Respiratory:  Respirations regular and unlabored. Clear to auscultation without rales, wheezing or rhonchi. GI: Soft, nontender, nondistended. Extremities:  Radials/DP/PT 2+ and equal bilaterally. No clubbing or cyanosis.  Mild bilateral ankle edema. Skin: Warm and dry, no rash. Neuro: Strength intact.  Assessment & Plan    Nonobstructive CAD.  CT cardiac scoring June 2022 showed calcium score 589, three-vessel coronary artery calcifications.  Patient denies chest pain, pressure, tightness.  Continue aspirin, atorvastatin, carvedilol. Hypertension. BP today 152/80 on intake, 136/78 on my recheck.  Home BP typically 130-140/70-80.  Patient denies headaches, dizziness or vision changes. Continue amlodipine, carvedilol, hydralazine, Benicar. Hyperlipidemia/statin intolerance.  LDL August 2023 84.  Continue atorvastatin every other day.  Followed by PCP.  Disposition: Return in 1 year or sooner as needed.         Signed, Etta Grandchild. Kyrah Schiro, DNP, NP-C

## 2023-03-14 DIAGNOSIS — H40013 Open angle with borderline findings, low risk, bilateral: Secondary | ICD-10-CM | POA: Diagnosis not present

## 2023-04-04 DIAGNOSIS — R7303 Prediabetes: Secondary | ICD-10-CM | POA: Diagnosis not present

## 2023-04-04 DIAGNOSIS — I10 Essential (primary) hypertension: Secondary | ICD-10-CM | POA: Diagnosis not present

## 2023-04-08 DIAGNOSIS — R7303 Prediabetes: Secondary | ICD-10-CM | POA: Diagnosis not present

## 2023-04-08 DIAGNOSIS — M19042 Primary osteoarthritis, left hand: Secondary | ICD-10-CM | POA: Diagnosis not present

## 2023-04-08 DIAGNOSIS — I10 Essential (primary) hypertension: Secondary | ICD-10-CM | POA: Diagnosis not present

## 2023-04-08 DIAGNOSIS — R600 Localized edema: Secondary | ICD-10-CM | POA: Diagnosis not present

## 2023-04-08 DIAGNOSIS — M19041 Primary osteoarthritis, right hand: Secondary | ICD-10-CM | POA: Diagnosis not present

## 2023-04-08 DIAGNOSIS — I2510 Atherosclerotic heart disease of native coronary artery without angina pectoris: Secondary | ICD-10-CM | POA: Diagnosis not present

## 2023-04-08 DIAGNOSIS — K8010 Calculus of gallbladder with chronic cholecystitis without obstruction: Secondary | ICD-10-CM | POA: Diagnosis not present

## 2023-04-08 DIAGNOSIS — Z0000 Encounter for general adult medical examination without abnormal findings: Secondary | ICD-10-CM | POA: Diagnosis not present

## 2023-04-08 DIAGNOSIS — E782 Mixed hyperlipidemia: Secondary | ICD-10-CM | POA: Diagnosis not present

## 2023-04-08 DIAGNOSIS — K862 Cyst of pancreas: Secondary | ICD-10-CM | POA: Diagnosis not present

## 2023-05-22 ENCOUNTER — Other Ambulatory Visit (HOSPITAL_COMMUNITY): Payer: Self-pay | Admitting: Internal Medicine

## 2023-05-22 DIAGNOSIS — N400 Enlarged prostate without lower urinary tract symptoms: Secondary | ICD-10-CM

## 2023-05-22 DIAGNOSIS — K8010 Calculus of gallbladder with chronic cholecystitis without obstruction: Secondary | ICD-10-CM

## 2023-05-22 DIAGNOSIS — I10 Essential (primary) hypertension: Secondary | ICD-10-CM

## 2023-05-22 DIAGNOSIS — K862 Cyst of pancreas: Secondary | ICD-10-CM

## 2023-05-30 ENCOUNTER — Other Ambulatory Visit: Payer: Self-pay | Admitting: Internal Medicine

## 2023-05-30 DIAGNOSIS — K862 Cyst of pancreas: Secondary | ICD-10-CM

## 2023-06-03 ENCOUNTER — Other Ambulatory Visit: Payer: Self-pay | Admitting: Internal Medicine

## 2023-06-10 DIAGNOSIS — E782 Mixed hyperlipidemia: Secondary | ICD-10-CM | POA: Diagnosis not present

## 2023-07-16 ENCOUNTER — Ambulatory Visit
Admission: RE | Admit: 2023-07-16 | Discharge: 2023-07-16 | Disposition: A | Discharge status: Home / Self Care | Payer: Medicare Other | Source: Ambulatory Visit | Attending: Internal Medicine | Admitting: Internal Medicine

## 2023-07-16 DIAGNOSIS — K862 Cyst of pancreas: Secondary | ICD-10-CM | POA: Diagnosis not present

## 2023-07-16 DIAGNOSIS — I700 Atherosclerosis of aorta: Secondary | ICD-10-CM | POA: Diagnosis not present

## 2023-07-16 DIAGNOSIS — K8020 Calculus of gallbladder without cholecystitis without obstruction: Secondary | ICD-10-CM | POA: Diagnosis not present

## 2023-07-16 MED ORDER — GADOPICLENOL 0.5 MMOL/ML IV SOLN
9.0000 mL | Freq: Once | INTRAVENOUS | Status: AC | PRN
  Administered 2023-07-16: 9 mL via INTRAVENOUS

## 2023-07-25 DIAGNOSIS — E782 Mixed hyperlipidemia: Secondary | ICD-10-CM | POA: Diagnosis not present

## 2023-07-29 DIAGNOSIS — H25013 Cortical age-related cataract, bilateral: Secondary | ICD-10-CM | POA: Diagnosis not present

## 2023-10-03 ENCOUNTER — Other Ambulatory Visit: Payer: Self-pay | Admitting: Student

## 2023-10-09 DIAGNOSIS — I10 Essential (primary) hypertension: Secondary | ICD-10-CM | POA: Diagnosis not present

## 2023-10-09 DIAGNOSIS — I2510 Atherosclerotic heart disease of native coronary artery without angina pectoris: Secondary | ICD-10-CM | POA: Diagnosis not present

## 2023-10-09 DIAGNOSIS — T466X5A Adverse effect of antihyperlipidemic and antiarteriosclerotic drugs, initial encounter: Secondary | ICD-10-CM | POA: Diagnosis not present

## 2023-10-09 DIAGNOSIS — E782 Mixed hyperlipidemia: Secondary | ICD-10-CM | POA: Diagnosis not present

## 2023-10-28 ENCOUNTER — Other Ambulatory Visit: Payer: Self-pay | Admitting: Student

## 2023-12-12 ENCOUNTER — Other Ambulatory Visit: Payer: Self-pay | Admitting: Student

## 2023-12-24 DIAGNOSIS — E782 Mixed hyperlipidemia: Secondary | ICD-10-CM | POA: Diagnosis not present

## 2024-01-06 ENCOUNTER — Other Ambulatory Visit: Payer: Self-pay | Admitting: Internal Medicine

## 2024-02-16 ENCOUNTER — Other Ambulatory Visit: Payer: Self-pay | Admitting: Internal Medicine

## 2024-03-03 ENCOUNTER — Other Ambulatory Visit: Payer: Self-pay | Admitting: Internal Medicine

## 2024-03-04 DIAGNOSIS — H401131 Primary open-angle glaucoma, bilateral, mild stage: Secondary | ICD-10-CM | POA: Diagnosis not present

## 2024-03-06 ENCOUNTER — Other Ambulatory Visit: Payer: Self-pay | Admitting: Internal Medicine

## 2024-03-11 ENCOUNTER — Ambulatory Visit: Attending: Internal Medicine | Admitting: Internal Medicine

## 2024-03-11 VITALS — BP 146/80 | HR 59 | Ht 68.0 in | Wt 203.0 lb

## 2024-03-11 DIAGNOSIS — M791 Myalgia, unspecified site: Secondary | ICD-10-CM | POA: Diagnosis not present

## 2024-03-11 DIAGNOSIS — E785 Hyperlipidemia, unspecified: Secondary | ICD-10-CM

## 2024-03-11 DIAGNOSIS — T466X5A Adverse effect of antihyperlipidemic and antiarteriosclerotic drugs, initial encounter: Secondary | ICD-10-CM | POA: Diagnosis not present

## 2024-03-11 DIAGNOSIS — I2510 Atherosclerotic heart disease of native coronary artery without angina pectoris: Secondary | ICD-10-CM

## 2024-03-11 DIAGNOSIS — I10 Essential (primary) hypertension: Secondary | ICD-10-CM

## 2024-03-11 MED ORDER — CARVEDILOL 25 MG PO TABS: 25.0000 mg | ORAL_TABLET | Freq: Two times a day (BID) | ORAL | 3 refills | Status: AC

## 2024-03-11 MED ORDER — HYDRALAZINE HCL 25 MG PO TABS: 25.0000 mg | ORAL_TABLET | Freq: Two times a day (BID) | ORAL | 3 refills | Status: AC

## 2024-03-11 NOTE — Addendum Note (Signed)
 Addended by: GLADIS REENA GAILS on: 03/11/2024 08:44 AM   Modules accepted: Orders

## 2024-03-11 NOTE — Progress Notes (Signed)
 OFFICE NOTE  Chief Complaint:  No complaints  Primary Care Physician: Clarice Nottingham, MD  HPI: Edward Bush is a 78 year old gentleman with hypertension, erectile dysfunction, mild dyslipidemia. Blood pressures were mildly elevated, however, he was having difficulty affording his previous regimen of Micardis and Tekturna. I had discontinued the Micardis and put him on valsartan /hydrochlorothiazide  which is working fairly well and he was interested in coming off the Tekturna due to cost. I recommended starting him on hydralazine  at this time and he is done fairly well with this. He actually is taking hydralazine  only once daily and twice if he needs to, but blood pressure control seems to be improved. He is otherwise asymptomatic, no chest pain, shortness of breath, palpitations, presyncope or syncopal symptoms. His other concerns today are that he is requesting refills on his buspirone which I will defer to his primary care provider. He also is interested in a sedan a filled prescription. He believes that he can get the medication cheaper through [facility] in [place].  I saw Edward Bush back in the office today. It's been a couple of years since I seen him. He's had some problems with swelling in his ankles and was switched to clonidine , but had significant dry mouth and then switch back to amlodipine . Unfortunately he's on a number of blood pressure medications and doses and requires his medicines to maintain normal blood pressures. He has no cardiac complaints, specifically denying any shortness of breath or chest pain. EKG today shows normal sinus rhythm at 61.  Edward Bush returns today for follow-up. He reports doing well over the past year. His swelling is improved significantly, probably due to cooler weather not being on his feet as much. Blood pressure is been pretty well-controlled although he is only taking the hydralazine  once a day. I suspect that he may be having afternoon rises in blood  pressure that is not aware of. I advised him to take the medicine at least twice a day, or more ideally 3 times a day. He says this is difficult to remember, which I understand. He denies any chest pain or worsening shortness of breath.  11/07/2016  Edward Bush returns today for follow-up. He reports that he feels very well. He denies any chest pain or shortness of breath. Blood pressure initially was 152/84. Recheck came down to 130/76. He's been compliant with medications although cannot take the hydralazine  3 times a day due to difficulty remembering it. He generally takes the 25 mg tablet twice daily. He was also placed on sildenafil  for erectile dysfunction although being on the 100 mg tablet over the past 6-8 months he notes that he has not been able to sustain erections although he can achieve them. Previously he had no difficulty with the medication.  12/17/2017  Edward Bush was seen today follow-up.  Overall he is doing great.  Blood pressure remains well controlled.  He denies any chest pain or worsening shortness of breath.  He could stand to lose a little bit of weight.  He says he has not been as physically active as they have been taking care of his father-in-law.  They expect to have more time in the near future to get in some more exercise.  04/19/2019  Edward Bush seen today in follow-up.  He continues to do well.  He actually lost about a pound since I saw him a year ago.  His blood pressures been well controlled.  Denies any chest pain or worsening shortness of  breath.  He is trying to walk about a mile a day or so.  He had been working to take care of his father-in-law who recently died.  2020/04/28  Edward Bush is seen today in follow-up.  Overall his blood pressure control looks good.  132/84 today.  He had seen Dr. For most recently on October 9.  Labs overall are pretty good with total cholesterol 178, triglycerides 130 LDL 117.  EKG today shows a sinus bradycardia.  03/11/2024  Edward Bush is  seen today in follow-up.  He seems to be doing fairly well.  He was having some issues with muscle aches.  I had adjusted and removed some of his statin medications and ultimately switched him to Repatha.  He felt however that he might of been having side effects with olmesartan and.  His twin brother who was a former patient of mine also had complained of pain related to the olmesartan.  That medicine was stopped but he reports his home blood pressure readings have been good in the 120s systolic.  Here the blood pressure was 146/80 and a recheck at the end of the visit was 140 systolic.  His blood pressure in the office in February was 154/86.  He uses a wrist cuff at home and I advised him to take some home blood pressure readings with an arm cuff and get back to us  with those readings as I am suspect that it may be giving him a lower blood pressure than he actually has.  Recent lipid testing on Repatha showed total cholesterol 103, HDL 39, triglycerides 156 and LDL 38.  PMHx:  Past Medical History:  Diagnosis Date   ED (erectile dysfunction)    History of nuclear stress test 06/24/2006   exercise myoview; normal pattern of perfusion; low risk scan    Hx of adenomatous colonic polyps 04/08/2017   Hyperlipemia    Hypertension     Past Surgical History:  Procedure Laterality Date   CATARACT EXTRACTION     HERNIA REPAIR     x2   TRANSTHORACIC ECHOCARDIOGRAM  04/29/2011   EF>55%; mild concentric LVH; LA mod dilated; trace MR/trace TR,/trace pulm valve regurg;     FAMHx:  Family History  Problem Relation Age of Onset   Hypertension Maternal Grandmother    Cancer Maternal Grandmother    Cancer - Lung Maternal Grandfather    Hypertension Mother    Hypertension Brother        x2   Cancer Brother        kidney cancer   Colon cancer Neg Hx     SOCHx:   reports that he has never smoked. He has never used smokeless tobacco. He reports that he does not drink alcohol and does not use  drugs.  ALLERGIES:  Allergies  Allergen Reactions   Sulfa Antibiotics Rash    ROS: Pertinent items noted in HPI and remainder of comprehensive ROS otherwise negative.  HOME MEDS: Current Outpatient Medications  Medication Sig Dispense Refill   amLODipine  (NORVASC ) 10 MG tablet TAKE 1 TABLET BY MOUTH DAILY 100 tablet 0   aspirin EC 81 MG tablet Take 81 mg by mouth daily.       carvedilol  (COREG ) 25 MG tablet Take 1 tablet (25 mg total) by mouth 2 (two) times daily with a meal. Please keep upcoming July appt for further refills. Thank you 120 tablet 0   hydrALAZINE  (APRESOLINE ) 25 MG tablet Take 1 tablet (25 mg total) by mouth 2 (  two) times daily. Please keep upcoming appointment with Dr. Mona on 7/24, in order to receive additional refills.Thank You. 180 tablet 0   latanoprost (XALATAN) 0.005 % ophthalmic solution 1 drop at bedtime.     REPATHA SURECLICK 140 MG/ML SOAJ 140 mg Subcutaneous every 14 days; Duration: 84 days     atorvastatin (LIPITOR) 10 MG tablet 1 tablet     olmesartan-hydrochlorothiazide  (BENICAR HCT) 40-25 MG tablet Take 1 tablet by mouth daily.     POTASSIUM PO Take 1 tablet by mouth daily. 595mg  qd     Current Facility-Administered Medications  Medication Dose Route Frequency Provider Last Rate Last Admin   0.9 %  sodium chloride  infusion  500 mL Intravenous Continuous Avram Lupita BRAVO, MD        LABS/IMAGING: No results found for this or any previous visit (from the past 48 hours). No results found.  VITALS: BP (!) 146/80 (BP Location: Left Arm, Patient Position: Sitting, Cuff Size: Normal)   Pulse (!) 59   Ht 5' 8 (1.727 m)   Wt 203 lb (92.1 kg)   SpO2 96%   BMI 30.87 kg/m   EXAM: General appearance: alert and no distress Neck: no adenopathy, no carotid bruit, no JVD, supple, symmetrical, trachea midline and thyroid  not enlarged, symmetric, no tenderness/mass/nodules Lungs: clear to auscultation bilaterally Heart: regular rate and rhythm, S1, S2  normal, no murmur, click, rub or gallop Abdomen: soft, non-tender; bowel sounds normal; no masses,  no organomegaly Extremities: extremities normal, atraumatic, no cyanosis or edema Pulses: 2+ and symmetric Skin: Skin color, texture, turgor normal. 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   Edward Bush denies any chest pain or shortness of breath.  He was found to have a high calcium score although only 66 percentile for age.  He had statin intolerance and now is on Repatha with recent good control over his lipids.  His blood pressure however may not be adequately controlled.  I have advised him to take some home arm cuff readings.  He was previously on olmesartan and HCTZ but this was stopped because he felt he was having some pain with that.  He may need to go back on valsartan  which he previously was on and tolerated.  He has some lower extremity edema likely related to amlodipine .  Have encouraged compression stockings.  No changes to his medications today.  Follow-up annually or sooner as necessary.  Kent KYM Mona, MD, [facility], FNLA, FACP  [facility]  Medical Director of the [facility]  Cardiovascular Risk Reduction Clinic Diplomate of the American Board of Clinical Lipidology Attending Cardiologist  Direct Dial: [phone]  Fax: [phone]   Website:  [email or web address]   Kent BROCKS Varie Machamer 03/11/2024, 8:25 AM

## 2024-03-11 NOTE — Patient Instructions (Signed)
 Medication Instructions:  No changes  *If you need a refill on your cardiac medications before your next appointment, please call your pharmacy*   Lab Work: Not needed    Testing/Procedures: Not needed   Follow-Up: At [facility], you and your health needs are our priority.  As part of our continuing mission to provide you with exceptional heart care, we have created designated Provider Care Teams.  These Care Teams include your primary Cardiologist (physician) and Advanced Practice Providers (APPs -  Physician Assistants and Nurse Practitioners) who all work together to provide you with the care you need, when you need it.     Your next appointment:   12 month(s)  The format for your next appointment:   In Person  Provider:   Chrystie Nose, MD

## 2024-04-08 DIAGNOSIS — R7303 Prediabetes: Secondary | ICD-10-CM | POA: Diagnosis not present

## 2024-04-08 DIAGNOSIS — I10 Essential (primary) hypertension: Secondary | ICD-10-CM | POA: Diagnosis not present

## 2024-04-13 DIAGNOSIS — I2510 Atherosclerotic heart disease of native coronary artery without angina pectoris: Secondary | ICD-10-CM | POA: Diagnosis not present

## 2024-04-13 DIAGNOSIS — R7303 Prediabetes: Secondary | ICD-10-CM | POA: Diagnosis not present

## 2024-04-13 DIAGNOSIS — G720 Drug-induced myopathy: Secondary | ICD-10-CM | POA: Diagnosis not present

## 2024-04-13 DIAGNOSIS — K862 Cyst of pancreas: Secondary | ICD-10-CM | POA: Diagnosis not present

## 2024-04-13 DIAGNOSIS — R600 Localized edema: Secondary | ICD-10-CM | POA: Diagnosis not present

## 2024-04-13 DIAGNOSIS — I10 Essential (primary) hypertension: Secondary | ICD-10-CM | POA: Diagnosis not present

## 2024-04-13 DIAGNOSIS — Z0000 Encounter for general adult medical examination without abnormal findings: Secondary | ICD-10-CM | POA: Diagnosis not present

## 2024-04-13 DIAGNOSIS — K8010 Calculus of gallbladder with chronic cholecystitis without obstruction: Secondary | ICD-10-CM | POA: Diagnosis not present
